# Patient Record
Sex: Female | Born: 1935 | Race: White | Hispanic: No | Marital: Single | Smoking: Former smoker
Health system: Southern US, Community
[De-identification: ages and names within clinical notes are randomized; demographics above are authoritative.]

## PROBLEM LIST (undated history)

## (undated) DIAGNOSIS — M170 Bilateral primary osteoarthritis of knee: Secondary | ICD-10-CM

## (undated) DIAGNOSIS — F0280 Dementia in other diseases classified elsewhere without behavioral disturbance: Secondary | ICD-10-CM

## (undated) DIAGNOSIS — G309 Alzheimer's disease, unspecified: Secondary | ICD-10-CM

## (undated) DIAGNOSIS — J449 Chronic obstructive pulmonary disease, unspecified: Secondary | ICD-10-CM

## (undated) DIAGNOSIS — F32A Depression, unspecified: Secondary | ICD-10-CM

## (undated) DIAGNOSIS — F329 Major depressive disorder, single episode, unspecified: Secondary | ICD-10-CM

## (undated) DIAGNOSIS — F419 Anxiety disorder, unspecified: Secondary | ICD-10-CM

## (undated) DIAGNOSIS — F0390 Unspecified dementia without behavioral disturbance: Secondary | ICD-10-CM

## (undated) DIAGNOSIS — R51 Headache: Secondary | ICD-10-CM

## (undated) DIAGNOSIS — E039 Hypothyroidism, unspecified: Secondary | ICD-10-CM

## (undated) DIAGNOSIS — Z9289 Personal history of other medical treatment: Secondary | ICD-10-CM

## (undated) HISTORY — PX: DILATION AND CURETTAGE OF UTERUS: SHX78

## (undated) HISTORY — PX: NASAL SINUS SURGERY: SHX719

## (undated) HISTORY — PX: TONSILLECTOMY: SUR1361

---

## 1963-01-22 DIAGNOSIS — Z9289 Personal history of other medical treatment: Secondary | ICD-10-CM

## 1963-01-22 HISTORY — PX: VAGINAL HYSTERECTOMY: SUR661

## 1963-01-22 HISTORY — DX: Personal history of other medical treatment: Z92.89

## 1988-09-21 HISTORY — PX: CATARACT EXTRACTION W/ INTRAOCULAR LENS  IMPLANT, BILATERAL: SHX1307

## 2012-11-18 ENCOUNTER — Observation Stay (HOSPITAL_COMMUNITY)
Admission: EM | Admit: 2012-11-18 | Discharge: 2012-11-19 | Disposition: A | Discharge status: Home / Self Care | Payer: Medicare Other | Attending: Internal Medicine | Admitting: Internal Medicine

## 2012-11-18 ENCOUNTER — Emergency Department (HOSPITAL_COMMUNITY): Payer: Medicare Other

## 2012-11-18 ENCOUNTER — Encounter (HOSPITAL_COMMUNITY): Payer: Self-pay | Admitting: Emergency Medicine

## 2012-11-18 DIAGNOSIS — D649 Anemia, unspecified: Secondary | ICD-10-CM | POA: Diagnosis present

## 2012-11-18 DIAGNOSIS — F29 Unspecified psychosis not due to a substance or known physiological condition: Secondary | ICD-10-CM | POA: Insufficient documentation

## 2012-11-18 DIAGNOSIS — Z87891 Personal history of nicotine dependence: Secondary | ICD-10-CM | POA: Insufficient documentation

## 2012-11-18 DIAGNOSIS — M1710 Unilateral primary osteoarthritis, unspecified knee: Secondary | ICD-10-CM | POA: Insufficient documentation

## 2012-11-18 DIAGNOSIS — Z79899 Other long term (current) drug therapy: Secondary | ICD-10-CM | POA: Insufficient documentation

## 2012-11-18 DIAGNOSIS — Z880 Allergy status to penicillin: Secondary | ICD-10-CM | POA: Insufficient documentation

## 2012-11-18 DIAGNOSIS — R609 Edema, unspecified: Secondary | ICD-10-CM | POA: Insufficient documentation

## 2012-11-18 DIAGNOSIS — R079 Chest pain, unspecified: Secondary | ICD-10-CM

## 2012-11-18 DIAGNOSIS — F411 Generalized anxiety disorder: Secondary | ICD-10-CM | POA: Insufficient documentation

## 2012-11-18 DIAGNOSIS — F0280 Dementia in other diseases classified elsewhere without behavioral disturbance: Secondary | ICD-10-CM | POA: Diagnosis present

## 2012-11-18 DIAGNOSIS — R0789 Other chest pain: Principal | ICD-10-CM | POA: Insufficient documentation

## 2012-11-18 DIAGNOSIS — E039 Hypothyroidism, unspecified: Secondary | ICD-10-CM | POA: Insufficient documentation

## 2012-11-18 DIAGNOSIS — Z885 Allergy status to narcotic agent status: Secondary | ICD-10-CM | POA: Insufficient documentation

## 2012-11-18 DIAGNOSIS — IMO0002 Reserved for concepts with insufficient information to code with codable children: Secondary | ICD-10-CM | POA: Insufficient documentation

## 2012-11-18 DIAGNOSIS — G309 Alzheimer's disease, unspecified: Secondary | ICD-10-CM | POA: Insufficient documentation

## 2012-11-18 DIAGNOSIS — J441 Chronic obstructive pulmonary disease with (acute) exacerbation: Secondary | ICD-10-CM | POA: Insufficient documentation

## 2012-11-18 DIAGNOSIS — F0390 Unspecified dementia without behavioral disturbance: Secondary | ICD-10-CM | POA: Insufficient documentation

## 2012-11-18 HISTORY — DX: Personal history of other medical treatment: Z92.89

## 2012-11-18 HISTORY — DX: Chronic obstructive pulmonary disease, unspecified: J44.9

## 2012-11-18 HISTORY — DX: Bilateral primary osteoarthritis of knee: M17.0

## 2012-11-18 HISTORY — DX: Dementia in other diseases classified elsewhere, unspecified severity, without behavioral disturbance, psychotic disturbance, mood disturbance, and anxiety: F02.80

## 2012-11-18 HISTORY — DX: Alzheimer's disease, unspecified: G30.9

## 2012-11-18 HISTORY — DX: Anxiety disorder, unspecified: F41.9

## 2012-11-18 HISTORY — DX: Depression, unspecified: F32.A

## 2012-11-18 HISTORY — DX: Headache: R51

## 2012-11-18 HISTORY — DX: Hypothyroidism, unspecified: E03.9

## 2012-11-18 HISTORY — DX: Major depressive disorder, single episode, unspecified: F32.9

## 2012-11-18 HISTORY — DX: Unspecified dementia, unspecified severity, without behavioral disturbance, psychotic disturbance, mood disturbance, and anxiety: F03.90

## 2012-11-18 LAB — CBC WITH DIFFERENTIAL/PLATELET
Eosinophils Absolute: 0.6 10*3/uL (ref 0.0–0.7)
Eosinophils Relative: 8 % — ABNORMAL HIGH (ref 0–5)
HCT: 32.2 % — ABNORMAL LOW (ref 36.0–46.0)
Hemoglobin: 10.5 g/dL — ABNORMAL LOW (ref 12.0–15.0)
Lymphocytes Relative: 19 % (ref 12–46)
Lymphs Abs: 1.5 10*3/uL (ref 0.7–4.0)
MCHC: 32.6 g/dL (ref 30.0–36.0)
MCV: 91.7 fL (ref 78.0–100.0)
Monocytes Absolute: 0.6 10*3/uL (ref 0.1–1.0)
Monocytes Relative: 8 % (ref 3–12)
RBC: 3.51 MIL/uL — ABNORMAL LOW (ref 3.87–5.11)
RDW: 13 % (ref 11.5–15.5)
WBC: 8 10*3/uL (ref 4.0–10.5)

## 2012-11-18 LAB — COMPREHENSIVE METABOLIC PANEL
ALT: 13 U/L (ref 0–35)
AST: 16 U/L (ref 0–37)
BUN: 25 mg/dL — ABNORMAL HIGH (ref 6–23)
CO2: 25 mEq/L (ref 19–32)
Calcium: 8.7 mg/dL (ref 8.4–10.5)
Creatinine, Ser: 0.98 mg/dL (ref 0.50–1.10)
GFR calc Af Amer: 59 mL/min — ABNORMAL LOW (ref >90)
GFR calc non Af Amer: 51 mL/min — ABNORMAL LOW (ref >90)
Glucose, Bld: 89 mg/dL (ref 70–99)
Sodium: 137 mEq/L (ref 135–145)
Total Protein: 7.2 g/dL (ref 6.0–8.3)

## 2012-11-18 LAB — TROPONIN I: Troponin I: <0.3 ng/mL (ref <0.30)

## 2012-11-18 LAB — PRO B NATRIURETIC PEPTIDE: Pro B Natriuretic peptide (BNP): 530.7 pg/mL — ABNORMAL HIGH (ref 0–450)

## 2012-11-18 MED ORDER — CELECOXIB 200 MG PO CAPS
200.0000 mg | ORAL_CAPSULE | Freq: Two times a day (BID) | ORAL | Status: DC
  Administered 2012-11-18 – 2012-11-19 (×2): 200 mg via ORAL
  Filled 2012-11-18 (×3): qty 1

## 2012-11-18 MED ORDER — ACETAMINOPHEN 650 MG RE SUPP: 650.0000 mg | Freq: Four times a day (QID) | RECTAL | Status: DC | PRN

## 2012-11-18 MED ORDER — CITALOPRAM HYDROBROMIDE 10 MG PO TABS
10.0000 mg | ORAL_TABLET | Freq: Every day | ORAL | Status: DC
  Administered 2012-11-19: 10 mg via ORAL
  Filled 2012-11-18: qty 1

## 2012-11-18 MED ORDER — LORATADINE 10 MG PO TABS
10.0000 mg | ORAL_TABLET | Freq: Every day | ORAL | Status: DC
  Administered 2012-11-19: 10 mg via ORAL
  Filled 2012-11-18: qty 1

## 2012-11-18 MED ORDER — SODIUM CHLORIDE 0.9 % IJ SOLN
3.0000 mL | Freq: Two times a day (BID) | INTRAMUSCULAR | Status: DC
  Administered 2012-11-18: 3 mL via INTRAVENOUS

## 2012-11-18 MED ORDER — FLUTICASONE PROPIONATE HFA 220 MCG/ACT IN AERO
1.0000 | INHALATION_SPRAY | Freq: Two times a day (BID) | RESPIRATORY_TRACT | Status: DC
  Administered 2012-11-19: 1 via RESPIRATORY_TRACT
  Filled 2012-11-18 (×2): qty 12

## 2012-11-18 MED ORDER — ENOXAPARIN SODIUM 30 MG/0.3ML ~~LOC~~ SOLN
30.0000 mg | SUBCUTANEOUS | Status: DC
  Administered 2012-11-18: 30 mg via SUBCUTANEOUS
  Filled 2012-11-18 (×2): qty 0.3

## 2012-11-18 MED ORDER — ENOXAPARIN SODIUM 40 MG/0.4ML ~~LOC~~ SOLN: 40.0000 mg | SUBCUTANEOUS | Status: DC

## 2012-11-18 MED ORDER — LORAZEPAM 0.5 MG PO TABS: 0.2500 mg | ORAL_TABLET | ORAL | Status: DC | PRN

## 2012-11-18 MED ORDER — TRAMADOL HCL 50 MG PO TABS
50.0000 mg | ORAL_TABLET | Freq: Three times a day (TID) | ORAL | Status: DC
  Administered 2012-11-18 – 2012-11-19 (×2): 50 mg via ORAL
  Filled 2012-11-18 (×2): qty 1

## 2012-11-18 MED ORDER — LORAZEPAM 2 MG/ML IJ SOLN
1.0000 mg | Freq: Once | INTRAMUSCULAR | Status: AC
  Administered 2012-11-18: 1 mg via INTRAVENOUS
  Filled 2012-11-18: qty 1

## 2012-11-18 MED ORDER — MEMANTINE HCL ER 28 MG PO CP24
28.0000 mg | ORAL_CAPSULE | Freq: Every day | ORAL | Status: DC
  Filled 2012-11-18 (×2): qty 28

## 2012-11-18 MED ORDER — ONDANSETRON HCL 4 MG PO TABS: 4.0000 mg | ORAL_TABLET | Freq: Four times a day (QID) | ORAL | Status: DC | PRN

## 2012-11-18 MED ORDER — MIRTAZAPINE 7.5 MG PO TABS
7.5000 mg | ORAL_TABLET | Freq: Every day | ORAL | Status: DC
  Administered 2012-11-18: 7.5 mg via ORAL
  Filled 2012-11-18 (×2): qty 1

## 2012-11-18 MED ORDER — ASPIRIN 325 MG PO TABS
325.0000 mg | ORAL_TABLET | Freq: Every day | ORAL | Status: DC
  Administered 2012-11-18 – 2012-11-19 (×2): 325 mg via ORAL
  Filled 2012-11-18 (×2): qty 1

## 2012-11-18 MED ORDER — NITROGLYCERIN 0.4 MG SL SUBL: 0.4000 mg | SUBLINGUAL_TABLET | SUBLINGUAL | Status: DC | PRN

## 2012-11-18 MED ORDER — RISPERIDONE 0.25 MG PO TABS
0.2500 mg | ORAL_TABLET | Freq: Every day | ORAL | Status: DC
  Administered 2012-11-19: 0.25 mg via ORAL
  Filled 2012-11-18: qty 1

## 2012-11-18 MED ORDER — SODIUM CHLORIDE 0.9 % IV SOLN
INTRAVENOUS | Status: DC
  Administered 2012-11-18: 16:00:00 via INTRAVENOUS

## 2012-11-18 MED ORDER — ONDANSETRON HCL 4 MG/2ML IJ SOLN: 4.0000 mg | Freq: Four times a day (QID) | INTRAMUSCULAR | Status: DC | PRN

## 2012-11-18 MED ORDER — MEMANTINE HCL ER 28 MG PO CP24: 28.0000 mg | ORAL_CAPSULE | Freq: Every day | ORAL | Status: DC

## 2012-11-18 MED ORDER — ACETAMINOPHEN 325 MG PO TABS: 650.0000 mg | ORAL_TABLET | Freq: Four times a day (QID) | ORAL | Status: DC | PRN

## 2012-11-18 NOTE — ED Notes (Signed)
Per EMS, pt was watching tv this am and started having chest pain. Pt sts some SOB. Denies chest pain currently. 12 lead nothing acute. Pt significant swelling to BLE. Chronic right knee pain. BP 141/64. Pulse 69. sats 95

## 2012-11-18 NOTE — ED Provider Notes (Signed)
CSN: [id number]     Arrival date & time 11/18/12  1245 History   First MD Initiated Contact with Patient 11/18/12 1352     Chief Complaint  Patient presents with  . Chest Pain   (Consider location/radiation/quality/duration/timing/severity/associated sxs/prior Treatment) Patient is a 79 y.o. female presenting with chest pain. The history is provided by the patient, a relative, the EMS personnel and a friend. The history is limited by the condition of the patient.  Chest Pain Associated symptoms: shortness of breath   Associated symptoms: no abdominal pain, no nausea and not vomiting    5 caveat applies to the history due to patient's dementia. Patient lives at assisted living consider whether at 11:30 this morning appeared to be in severe chest pain. Associated with shortness of breath patient initially denied any pain here upon arrival but then mentioned perhaps there is some pain. She's had bilateral lower trimming the swelling for some time. Patient cannot recall where the pain is now. Or was now.  Past Medical History  Diagnosis Date  . Dementia   . Anxiety   . Arthritis    History reviewed. No pertinent past surgical history. History reviewed. No pertinent family history. History  Substance Use Topics  . Smoking status: Never Smoker   . Smokeless tobacco: Not on file  . Alcohol Use: No   OB History   Grav Para Term Preterm Abortions TAB SAB Ect Mult Living                 Review of Systems  Unable to perform ROS Respiratory: Positive for shortness of breath.   Cardiovascular: Positive for chest pain.  Gastrointestinal: Negative for nausea, vomiting and abdominal pain.  Hematological: Does not bruise/bleed easily.  Psychiatric/Behavioral: Positive for confusion.   the rest of the review of systems is difficult to ascertain due to patient's history of dementia. She is a level V caveat.  Allergies  Morphine and related and Penicillins  Home Medications   Current  Outpatient Rx  Name  Route  Sig  Dispense  Refill  . acetaminophen (Q-NOL) 325 MG tablet   Oral   Take 325 mg by mouth 2 (two) times daily.         . celecoxib (CELEBREX) 200 MG capsule   Oral   Take 200 mg by mouth 2 (two) times daily.         . cetirizine (ZYRTEC) 10 MG tablet   Oral   Take 10 mg by mouth at bedtime.         . citalopram (CELEXA) 10 MG tablet   Oral   Take 10 mg by mouth daily.         . diclofenac sodium (VOLTAREN) 1 % GEL   Topical   Apply 4 g topically 2 (two) times daily. Apply to right knee         . fluticasone (FLOVENT HFA) 220 MCG/ACT inhaler   Inhalation   Inhale 1 puff into the lungs 2 (two) times daily.         Marland Kitchen LORazepam (ATIVAN) 0.5 MG tablet   Oral   Take 0.25 mg by mouth every 4 (four) hours as needed for anxiety. Do not exceed 1.5 mg /24h         . Memantine HCl ER (NAMENDA XR) 28 MG CP24   Oral   Take 28 mg by mouth daily.         . mirtazapine (REMERON) 15 MG tablet   Oral  Take 7.5 mg by mouth at bedtime.         . risperiDONE (RISPERDAL) 0.25 MG tablet   Oral   Take 0.25 mg by mouth daily.         . traMADol (ULTRAM) 50 MG tablet   Oral   Take 50 mg by mouth 3 (three) times daily.          BP 136/51  Pulse 64  Temp(Src) 97.8 F (36.6 C) (Oral)  Resp 13  SpO2 97% Physical Exam  Nursing note and vitals reviewed. Constitutional: She is oriented to person, place, and time. She appears well-developed and well-nourished. No distress.  HENT:  Head: Normocephalic and atraumatic.  Mouth/Throat: Oropharynx is clear and moist.  Eyes: Conjunctivae and EOM are normal. Pupils are equal, round, and reactive to light.  Neck: Normal range of motion.  Cardiovascular: Normal rate and regular rhythm.   No murmur heard. Pulmonary/Chest: Effort normal and breath sounds normal. No respiratory distress.  Abdominal: Soft. Bowel sounds are normal. There is no tenderness.  Musculoskeletal: Normal range of motion. She  exhibits edema.  Neurological: She is alert and oriented to person, place, and time. No cranial nerve deficit. She exhibits normal muscle tone. Coordination normal.  Skin: Skin is warm. No rash noted.    ED Course  Procedures (including critical care time) Labs Review Labs Reviewed  CBC WITH DIFFERENTIAL - Abnormal; Notable for the following:    RBC 3.51 (*)    Hemoglobin 10.5 (*)    HCT 32.2 (*)    Eosinophils Relative 8 (*)    All other components within normal limits  COMPREHENSIVE METABOLIC PANEL - Abnormal; Notable for the following:    BUN 25 (*)    GFR calc non Af Amer 51 (*)    GFR calc Af Amer 59 (*)    All other components within normal limits  PRO B NATRIURETIC PEPTIDE - Abnormal; Notable for the following:    Pro B Natriuretic peptide (BNP) 530.7 (*)    All other components within normal limits  TROPONIN I  URINALYSIS, ROUTINE W REFLEX MICROSCOPIC   Results for orders placed during the hospital encounter of 11/18/12  TROPONIN I      Result Value Range   Troponin I <0.30  <0.30 ng/mL  CBC WITH DIFFERENTIAL      Result Value Range   WBC 8.0  4.0 - 10.5 K/uL   RBC 3.51 (*) 3.87 - 5.11 MIL/uL   Hemoglobin 10.5 (*) 12.0 - 15.0 g/dL   HCT 16.1 (*) 09.6 - 04.5 %   MCV 91.7  78.0 - 100.0 fL   MCH 29.9  26.0 - 34.0 pg   MCHC 32.6  30.0 - 36.0 g/dL   RDW 40.9  81.1 - 91.4 %   Platelets 237  150 - 400 K/uL   Neutrophils Relative % 66  43 - 77 %   Neutro Abs 5.3  1.7 - 7.7 K/uL   Lymphocytes Relative 19  12 - 46 %   Lymphs Abs 1.5  0.7 - 4.0 K/uL   Monocytes Relative 8  3 - 12 %   Monocytes Absolute 0.6  0.1 - 1.0 K/uL   Eosinophils Relative 8 (*) 0 - 5 %   Eosinophils Absolute 0.6  0.0 - 0.7 K/uL   Basophils Relative 0  0 - 1 %   Basophils Absolute 0.0  0.0 - 0.1 K/uL  COMPREHENSIVE METABOLIC PANEL      Result Value Range  Sodium 137  135 - 145 mEq/L   Potassium 4.6  3.5 - 5.1 mEq/L   Chloride 102  96 - 112 mEq/L   CO2 25  19 - 32 mEq/L   Glucose, Bld 89  70  - 99 mg/dL   BUN 25 (*) 6 - 23 mg/dL   Creatinine, Ser 1.61  0.50 - 1.10 mg/dL   Calcium 8.7  8.4 - 09.6 mg/dL   Total Protein 7.2  6.0 - 8.3 g/dL   Albumin 3.6  3.5 - 5.2 g/dL   AST 16  0 - 37 U/L   ALT 13  0 - 35 U/L   Alkaline Phosphatase 69  39 - 117 U/L   Total Bilirubin 0.4  0.3 - 1.2 mg/dL   GFR calc non Af Amer 51 (*) >90 mL/min   GFR calc Af Amer 59 (*) >90 mL/min  PRO B NATRIURETIC PEPTIDE      Result Value Range   Pro B Natriuretic peptide (BNP) 530.7 (*) 0 - 450 pg/mL    Imaging Review Dg Chest 2 View  11/18/2012   CLINICAL DATA:  Chest pain.  EXAM: CHEST  2 VIEW  COMPARISON:  None.  FINDINGS: Cardiopericardial silhouette within normal limits. Aortic arch atherosclerosis. Apical lordotic projection on the frontal view. No airspace disease. No effusion. Lower thoracic compression fractures present, age indeterminate, a showing about 30% loss of anterior vertebral body height. This is probably at the T11 level.  IMPRESSION: 1. No acute cardiopulmonary disease. 2. Lower thoracic compression fracture is age indeterminate.   Electronically Signed   By: Andreas Newport M.D.   On: 11/18/2012 15:38    EKG Interpretation     Ventricular Rate:  64 PR Interval:  170 QRS Duration: 84 QT Interval:  432 QTC Calculation: 446 R Axis:   -13 Text Interpretation:  Sinus rhythm Low voltage, precordial leads Abnormal R-wave progression, early transition Consider anterior infarct No previous ECGs available            MDM   1. Chest pain    Patient with significant dementia.  He did have some sort of chest pain and then 11:30 this morning not clear when it resolved. Patient when she arrived here still is gone now but hasn't been in the room says his back. Initial troponins negative EKG without any acute changes. Chest x-ray without evidence of any pneumonia pneumothorax or pulmonary edema. Patient does have an elevated BNP. Patient has no cardiac history. Patient has no EKG for  comparison. Patient is followed by Dr. Smith Mince. She will most likely require admission for chest pain rule out due to her dementia and difficulty obtaining exactly when the chest pain went away.    Shelda Jakes, MD 11/18/12 [phone]

## 2012-11-18 NOTE — H&P (Signed)
[facility] History and Physical  Korin Setzler ZOX:[id number] DOB: 19-Feb-1924 DOA: 11/18/2012  Referring physician: ED PCP: Dr Lisbeth Renshaw in [place]  Chief Complaint:  Chest pain since 1 day  HPI:  79 year old female with severe  Alzheimer's dementia brought in by EMS for acute  onset of chest pain this morning. She reported of having substernal chest pain and back pain. Given her severe dementia she was unable to express the severity  and nature of the pain. It is also unclear how long she had the pain for. Her acute give her noticed that she was mildly short of breath as well. She forgot about having chest pain after a while and had to be reminded about the pain. Patient had baseline he is oriented to person and recognizes her immediate family only. Patient denied and headache, dizziness, nausea, vomiting, shortness of breath, abdominal pain. He did not have any fever or chills. Did not have any bowel or urinary symptoms.  In the ED EKG done was unremarkable. Initial troponin was negative. Blood work done showed mild anemia. Chest x-ray was unremarkable. [facility] called for admission and observation for rule out ACS.  Review of Systems:  As outlined in history of present illness. ROS limited due to severe dementia.    Past Medical History  Diagnosis Date  . Dementia   . Anxiety   . Arthritis    History reviewed. No pertinent past surgical history. Social History:  reports that she has never smoked. She does not have any smokeless tobacco history on file. She reports that she does not drink alcohol. Her drug history is not on file.  Allergies  Allergen Reactions  . Morphine And Related     On Nursing Home MAR, no reaction documented  . Penicillins     On Nursing Home MAR, no reaction documented     History reviewed. No pertinent family history.  Prior to Admission medications   Medication Sig Start Date End Date Taking? Authorizing Provider   acetaminophen (Q-NOL) 325 MG tablet Take 325 mg by mouth 2 (two) times daily.   Yes Historical Provider, MD  celecoxib (CELEBREX) 200 MG capsule Take 200 mg by mouth 2 (two) times daily.   Yes Historical Provider, MD  cetirizine (ZYRTEC) 10 MG tablet Take 10 mg by mouth at bedtime.   Yes Historical Provider, MD  citalopram (CELEXA) 10 MG tablet Take 10 mg by mouth daily.   Yes Historical Provider, MD  diclofenac sodium (VOLTAREN) 1 % GEL Apply 4 g topically 2 (two) times daily. Apply to right knee   Yes Historical Provider, MD  fluticasone (FLOVENT HFA) 220 MCG/ACT inhaler Inhale 1 puff into the lungs 2 (two) times daily.   Yes Historical Provider, MD  LORazepam (ATIVAN) 0.5 MG tablet Take 0.25 mg by mouth every 4 (four) hours as needed for anxiety. Do not exceed 1.5 mg /24h   Yes Historical Provider, MD  Memantine HCl ER (NAMENDA XR) 28 MG CP24 Take 28 mg by mouth daily.   Yes Historical Provider, MD  mirtazapine (REMERON) 15 MG tablet Take 7.5 mg by mouth at bedtime.   Yes Historical Provider, MD  risperiDONE (RISPERDAL) 0.25 MG tablet Take 0.25 mg by mouth daily.   Yes Historical Provider, MD  traMADol (ULTRAM) 50 MG tablet Take 50 mg by mouth 3 (three) times daily.   Yes Historical Provider, MD    Physical Exam:  Filed Vitals:   11/18/12 1415 11/18/12 1430 11/18/12 1445 11/18/12 1700  BP: 134/58  114/87 136/51 165/55  Pulse: 62 65 64 69  Temp:      TempSrc:      Resp: 16 23 13 13   SpO2: 96% 96% 97% 95%    Constitutional: Vital signs reviewed. Elderly female lying in bed in no acute distress Cardiovascular: RRR, S1 normal, S2 normal, no MRG, Pulmonary/Chest: CTAB, no wheezes, rales, or rhonchi Abdominal: Soft. Non-tender, non-distended, bowel sounds are normal, no masses, organomegaly, or guarding present.   extremities: Warm, no edema Neurological: A&O x1 ( oriented to self only), no focal deficit. Walks with a  Rolling walker .   Labs on Admission:  Basic Metabolic  Panel:  Recent Labs Lab 11/18/12 1544  NA 137  K 4.6  CL 102  CO2 25  GLUCOSE 89  BUN 25*  CREATININE 0.98  CALCIUM 8.7   Liver Function Tests:  Recent Labs Lab 11/18/12 1544  AST 16  ALT 13  ALKPHOS 69  BILITOT 0.4  PROT 7.2  ALBUMIN 3.6   No results found for this basename: LIPASE, AMYLASE,  in the last 168 hours No results found for this basename: AMMONIA,  in the last 168 hours CBC:  Recent Labs Lab 11/18/12 1544  WBC 8.0  NEUTROABS 5.3  HGB 10.5*  HCT 32.2*  MCV 91.7  PLT 237   Cardiac Enzymes:  Recent Labs Lab 11/18/12 1600  TROPONINI <0.30   BNP: No components found with this basename: POCBNP,  CBG: No results found for this basename: GLUCAP,  in the last 168 hours  Radiological Exams on Admission: Dg Chest 2 View  11/18/2012   CLINICAL DATA:  Chest pain.  EXAM: CHEST  2 VIEW  COMPARISON:  None.  FINDINGS: Cardiopericardial silhouette within normal limits. Aortic arch atherosclerosis. Apical lordotic projection on the frontal view. No airspace disease. No effusion. Lower thoracic compression fractures present, age indeterminate, a showing about 30% loss of anterior vertebral body height. This is probably at the T11 level.  IMPRESSION: 1. No acute cardiopulmonary disease. 2. Lower thoracic compression fracture is age indeterminate.   Electronically Signed   By: Andreas Newport M.D.   On: 11/18/2012 15:38    EKG: Normal sinus rhythm at 64, ST-T changes  Assessment/Plan Principal Problem:   Chest pain History very limited and unreliable due to severe dementia. He does not have significant risk factors for CAD and her HEARTscore for major cardiac event is low. Admit under observation. -aspirin 325 mg daily -s/l nitrate prn. Check lipid panel and Hb A1C -has trace edema with mildly elevated pro BNP. Check 2 D echo     Alzheimer's dementia Continue namenda    Anemia No baseline in system.     Code Status: DNR Family Communication:  daughter at bedside Disposition Plan: home in am if ruled out for ACS  Eddie North [facility] Pager [phone]  If 7PM-7AM, please contact night-coverage [email or web address] Password [facility] 11/18/2012, 6:13 PM   Total time spent: 50 minutes

## 2012-11-18 NOTE — ED Notes (Signed)
Patient transported to X-ray 

## 2012-11-18 NOTE — ED Notes (Signed)
Admitting MD in to assess pt for admission 

## 2012-11-19 LAB — HEMOGLOBIN A1C
Hgb A1c MFr Bld: 4.8 % (ref <5.7)
Mean Plasma Glucose: 91 mg/dL (ref <117)

## 2012-11-19 LAB — LIPID PANEL
Cholesterol: 220 mg/dL — ABNORMAL HIGH (ref 0–200)
VLDL: 31 mg/dL (ref 0–40)

## 2012-11-19 LAB — TROPONIN I: Troponin I: <0.3 ng/mL (ref <0.30)

## 2012-11-19 MED ORDER — FLUTICASONE PROPIONATE HFA 220 MCG/ACT IN AERO: 1.0000 | INHALATION_SPRAY | Freq: Two times a day (BID) | RESPIRATORY_TRACT | Status: DC

## 2012-11-19 NOTE — Progress Notes (Addendum)
CSW (Clinical Child psychotherapist) prepared pt dc packet and placed with shadow chart. Per facility, pt will not need a new FL2 as the pt has been out of the facility for less than 72 hours and is observation status in hospital.  Non emergent ambulance transport has been arranged. Pt family, facility, and nurse aware. CSW signing off.  Latonyia Lopata, LCSWA [phone]

## 2012-11-19 NOTE — Discharge Summary (Signed)
Physician Discharge Summary  Meagan Meagan [name] UJW:[id number] DOB: September 21, 1924 DOA: 11/18/2012  PCP: Talmadge Coventry, MD  Admit date: 11/18/2012 Discharge date: 11/19/2012  Time spent:25 minutes  Recommendations for Outpatient Follow-up:  D/c to assist living. follow up with PCP in 1 week. Please consider 2D echo as outpatient if patient has further symptoms.  Discharge Diagnoses:  Principal Problem:   Chest pain, likely musculoskeletal  Active Problems:   Alzheimer's dementia   Anemia   Discharge Condition: fair  Diet recommendation: *regular  There were no vitals filed for this visit.  History of present illness:  79 year old Meagan [name] with severe Alzheimer's dementia brought in by EMS for acute onset of chest pain this morning. She reported of having substernal chest pain and back pain. Given her severe dementia she was unable to express the severity and nature of the pain. It is also unclear how long she had the pain for. Her caregiver noticed that she was mildly short of breath as well. She forgot about having chest pain after a while and had to be reminded about the pain. Patient at baseline is oriented to person and recognizes her immediate family only.  Patient denied and headache, dizziness, nausea, vomiting, shortness of breath, abdominal pain. He did not have any fever or chills. Did not have any bowel or urinary symptoms.  In the ED  EKG done was unremarkable. Initial troponin was negative. Blood work done showed mild anemia. Chest x-ray was unremarkable. [facility] called for admission and observation for rule out ACS.   Hospital Course:  Patient admitted under observation on telemetry. Placed on ASA and prn s/l nitrate. Serial troponins were negative. Denied further chest pain symptoms. Her HEART score is quite low for major cardiac events. She doesn't  need inpatient 2 D echo earlier planned. Her symptoms are likely musculoskeletal in nature.    Patient  clinically stable and can be discharged home with outpt PCP follow up.   Consultations:  none  Discharge Exam: Filed Vitals:   11/19/12 0345  BP: 133/82  Pulse: 67  Temp: 98.1 F (36.7 C)  Resp: 18    genenral:. Elderly Meagan [name] lying in bed in no acute distress  Cardiovascular: RRR, S1 normal, S2 normal, no MRG,  Pulmonary/Chest: CTAB, no wheezes, rales, or rhonchi  Abdominal: Soft. Non-tender, non-distended, bowel sounds are normal,  extremities: Warm, no edema Neurological: A&O x1 ( oriented to self only),  Discharge Instructions     Medication List         celecoxib 200 MG capsule  Commonly known as:  CELEBREX  Take 200 mg by mouth 2 (two) times daily.     cetirizine 10 MG tablet  Commonly known as:  ZYRTEC  Take 10 mg by mouth at bedtime.     citalopram 10 MG tablet  Commonly known as:  CELEXA  Take 10 mg by mouth daily.     diclofenac sodium 1 % Gel  Commonly known as:  VOLTAREN  Apply 4 g topically 2 (two) times daily. Apply to right knee     fluticasone 220 MCG/ACT inhaler  Commonly known as:  FLOVENT HFA  Inhale 1 puff into the lungs 2 (two) times daily.     LORazepam 0.5 MG tablet  Commonly known as:  ATIVAN  Take 0.25 mg by mouth every 4 (four) hours as needed for anxiety. Do not exceed 1.5 mg /24h     mirtazapine 15 MG tablet  Commonly known as:  REMERON  Take 7.5 mg by  mouth at bedtime.     NAMENDA XR 28 MG Cp24  Generic drug:  Memantine HCl ER  Take 28 mg by mouth daily.     Q-NOL 325 MG tablet  Generic drug:  acetaminophen  Take 325 mg by mouth 2 (two) times daily.     risperiDONE 0.25 MG tablet  Commonly known as:  RISPERDAL  Take 0.25 mg by mouth daily.     traMADol 50 MG tablet  Commonly known as:  ULTRAM  Take 50 mg by mouth 3 (three) times daily.       Allergies  Allergen Reactions  . Morphine And Related     On Nursing Home MAR, no reaction documented  . Penicillins     On Nursing Home MAR, no reaction documented         Follow-up Information   Follow up with MAZZOCCHI, Rise Mu, MD In 1 week.   Specialty:  Family Medicine   Contact information:   [address] Laurell Josephs[address] [place] [address] [phone]        The results of significant diagnostics from this hospitalization (including imaging, microbiology, ancillary and laboratory) are listed below for reference.    Significant Diagnostic Studies: Dg Chest 2 View  11/18/2012   CLINICAL DATA:  Chest pain.  EXAM: CHEST  2 VIEW  COMPARISON:  None.  FINDINGS: Cardiopericardial silhouette within normal limits. Aortic arch atherosclerosis. Apical lordotic projection on the frontal view. No airspace disease. No effusion. Lower thoracic compression fractures present, 79 indeterminate, a showing about 30% loss of anterior vertebral body height. This is probably at the T11 level.  IMPRESSION: 1. No acute cardiopulmonary disease. 2. Lower thoracic compression fracture is age indeterminate.   Electronically Signed   By: Andreas Newport M.D.   On: 11/18/2012 15:38    Microbiology: No results found for this or any previous visit (from the past 240 hour(s)).   Labs: Basic Metabolic Panel:  Recent Labs Lab 11/18/12 1544  NA 137  K 4.6  CL 102  CO2 25  GLUCOSE 89  BUN 25*  CREATININE 0.98  CALCIUM 8.7   Liver Function Tests:  Recent Labs Lab 11/18/12 1544  AST 16  ALT 13  ALKPHOS 69  BILITOT 0.4  PROT 7.2  ALBUMIN 3.6   No results found for this basename: LIPASE, AMYLASE,  in the last 168 hours No results found for this basename: AMMONIA,  in the last 168 hours CBC:  Recent Labs Lab 11/18/12 1544  WBC 8.0  NEUTROABS 5.3  HGB 10.5*  HCT 32.2*  MCV 91.7  PLT 237   Cardiac Enzymes:  Recent Labs Lab 11/18/12 1600 11/18/12 2200 11/19/12 0425  TROPONINI <0.30 <0.30 <0.30   BNP: BNP (last 3 results)  Recent Labs  11/18/12 1600  PROBNP 530.7*   CBG: No results found for this basename: GLUCAP,  in the last  168 hours     Signed:  Rosangela Fehrenbach  [facility] 11/19/2012, 9:59 AM

## 2012-11-19 NOTE — Progress Notes (Signed)
Clinical Social Work Department BRIEF PSYCHOSOCIAL ASSESSMENT 11/19/2012  Patient:  Meagan [name], Meagan [name]     Account Number:  [id number]     Admit date:  11/18/2012  Clinical Social Worker:  Harless Nakayama  Date/Time:  11/19/2012 09:30 AM  Referred by:  Physician  Date Referred:  11/19/2012 Referred for  ALF Placement   Other Referral:   Interview type:  Family Other interview type:   Spoke with pt daughter    PSYCHOSOCIAL DATA Living Status:  FACILITY Admitted from facility:  [facility] Level of care:  Assisted Living Primary support name:  [name] [phone] Primary support relationship to patient:  CHILD, ADULT Degree of support available:   Pt has supportive daughter and son-in-law    CURRENT CONCERNS Current Concerns  Post-Acute Placement   Other Concerns:    SOCIAL WORK ASSESSMENT / PLAN CSW informed that pt was admitted from facility. CSW spoke with pt daugher Britta Mccreedy and confirmed pt is form [facility] and plan is to return. Pt daughter informed CSW that pt will need non-emergent ambulance transport. CSW called and left messgae with Arline Asp at [facility] to confirm pt is okay to return and to inquire as to whether pt will need new FL2 as she has been here observation less than 24 hrs. CSW awaiting return phone call. CSW informed pt nurse will need Gold DNR form to transport pt.   Assessment/plan status:  Psychosocial Support/Ongoing Assessment of Needs Other assessment/ plan:   Information/referral to community resources:   None needed    PATIENT'S/FAMILY'S RESPONSE TO PLAN OF CARE: Pt family agreeable to pt returning to ALF       [employer], LCSWA [phone]

## 2013-02-15 ENCOUNTER — Emergency Department (HOSPITAL_COMMUNITY): Payer: Medicare Other

## 2013-02-15 ENCOUNTER — Encounter (HOSPITAL_COMMUNITY): Payer: Self-pay | Admitting: Emergency Medicine

## 2013-02-15 ENCOUNTER — Inpatient Hospital Stay (HOSPITAL_COMMUNITY)
Admission: EM | Admit: 2013-02-15 | Discharge: 2013-02-19 | DRG: 640 | Disposition: A | Discharge status: Nursing Home (Includes ICF) | Payer: Medicare Other | Attending: Internal Medicine | Admitting: Internal Medicine

## 2013-02-15 DIAGNOSIS — G9341 Metabolic encephalopathy: Secondary | ICD-10-CM | POA: Diagnosis present

## 2013-02-15 DIAGNOSIS — E039 Hypothyroidism, unspecified: Secondary | ICD-10-CM | POA: Diagnosis present

## 2013-02-15 DIAGNOSIS — Z87440 Personal history of urinary (tract) infections: Secondary | ICD-10-CM

## 2013-02-15 DIAGNOSIS — G309 Alzheimer's disease, unspecified: Secondary | ICD-10-CM | POA: Diagnosis present

## 2013-02-15 DIAGNOSIS — F411 Generalized anxiety disorder: Secondary | ICD-10-CM | POA: Diagnosis present

## 2013-02-15 DIAGNOSIS — R4182 Altered mental status, unspecified: Secondary | ICD-10-CM | POA: Diagnosis present

## 2013-02-15 DIAGNOSIS — Z885 Allergy status to narcotic agent status: Secondary | ICD-10-CM

## 2013-02-15 DIAGNOSIS — M1710 Unilateral primary osteoarthritis, unspecified knee: Secondary | ICD-10-CM | POA: Diagnosis present

## 2013-02-15 DIAGNOSIS — D509 Iron deficiency anemia, unspecified: Secondary | ICD-10-CM | POA: Diagnosis present

## 2013-02-15 DIAGNOSIS — N179 Acute kidney failure, unspecified: Secondary | ICD-10-CM | POA: Diagnosis present

## 2013-02-15 DIAGNOSIS — N182 Chronic kidney disease, stage 2 (mild): Secondary | ICD-10-CM | POA: Diagnosis present

## 2013-02-15 DIAGNOSIS — Z880 Allergy status to penicillin: Secondary | ICD-10-CM

## 2013-02-15 DIAGNOSIS — R32 Unspecified urinary incontinence: Secondary | ICD-10-CM | POA: Diagnosis present

## 2013-02-15 DIAGNOSIS — J449 Chronic obstructive pulmonary disease, unspecified: Secondary | ICD-10-CM | POA: Diagnosis present

## 2013-02-15 DIAGNOSIS — Z79899 Other long term (current) drug therapy: Secondary | ICD-10-CM

## 2013-02-15 DIAGNOSIS — F329 Major depressive disorder, single episode, unspecified: Secondary | ICD-10-CM | POA: Diagnosis present

## 2013-02-15 DIAGNOSIS — Z87891 Personal history of nicotine dependence: Secondary | ICD-10-CM

## 2013-02-15 DIAGNOSIS — D649 Anemia, unspecified: Secondary | ICD-10-CM

## 2013-02-15 DIAGNOSIS — Z66 Do not resuscitate: Secondary | ICD-10-CM | POA: Diagnosis present

## 2013-02-15 DIAGNOSIS — E871 Hypo-osmolality and hyponatremia: Principal | ICD-10-CM | POA: Diagnosis present

## 2013-02-15 DIAGNOSIS — J4489 Other specified chronic obstructive pulmonary disease: Secondary | ICD-10-CM | POA: Diagnosis present

## 2013-02-15 DIAGNOSIS — F0280 Dementia in other diseases classified elsewhere without behavioral disturbance: Secondary | ICD-10-CM | POA: Diagnosis present

## 2013-02-15 DIAGNOSIS — F3289 Other specified depressive episodes: Secondary | ICD-10-CM | POA: Diagnosis present

## 2013-02-15 LAB — POCT I-STAT TROPONIN I: TROPONIN I, POC: 0.03 ng/mL (ref 0.00–0.08)

## 2013-02-15 LAB — CBC
HCT: 27.5 % — ABNORMAL LOW (ref 36.0–46.0)
Hemoglobin: 9.4 g/dL — ABNORMAL LOW (ref 12.0–15.0)
MCH: 29.7 pg (ref 26.0–34.0)
MCHC: 34.2 g/dL (ref 30.0–36.0)
MCV: 86.8 fL (ref 78.0–100.0)
Platelets: 223 10*3/uL (ref 150–400)
RBC: 3.17 MIL/uL — ABNORMAL LOW (ref 3.87–5.11)
RDW: 12.7 % (ref 11.5–15.5)
WBC: 4.4 10*3/uL (ref 4.0–10.5)

## 2013-02-15 LAB — CBC WITH DIFFERENTIAL/PLATELET
BASOS ABS: 0 10*3/uL (ref 0.0–0.1)
BASOS PCT: 0 % (ref 0–1)
EOS ABS: 0.1 10*3/uL (ref 0.0–0.7)
EOS PCT: 2 % (ref 0–5)
HEMATOCRIT: 29.2 % — AB (ref 36.0–46.0)
HEMOGLOBIN: 10.2 g/dL — AB (ref 12.0–15.0)
Lymphocytes Relative: 14 % (ref 12–46)
Lymphs Abs: 0.7 10*3/uL (ref 0.7–4.0)
MCH: 30.1 pg (ref 26.0–34.0)
MCHC: 34.9 g/dL (ref 30.0–36.0)
MCV: 86.1 fL (ref 78.0–100.0)
MONO ABS: 1.1 10*3/uL — AB (ref 0.1–1.0)
MONOS PCT: 22 % — AB (ref 3–12)
NEUTROS ABS: 3.1 10*3/uL (ref 1.7–7.7)
Neutrophils Relative %: 61 % (ref 43–77)
Platelets: 261 10*3/uL (ref 150–400)
RBC: 3.39 MIL/uL — ABNORMAL LOW (ref 3.87–5.11)
RDW: 12.8 % (ref 11.5–15.5)
WBC: 5.1 10*3/uL (ref 4.0–10.5)

## 2013-02-15 LAB — HEPATIC FUNCTION PANEL
ALBUMIN: 3.7 g/dL (ref 3.5–5.2)
ALT: 13 U/L (ref 0–35)
AST: 16 U/L (ref 0–37)
Alkaline Phosphatase: 62 U/L (ref 39–117)
Bilirubin, Direct: <0.2 mg/dL (ref 0.0–0.3)
Total Bilirubin: 0.5 mg/dL (ref 0.3–1.2)
Total Protein: 7 g/dL (ref 6.0–8.3)

## 2013-02-15 LAB — URINALYSIS, ROUTINE W REFLEX MICROSCOPIC
Bilirubin Urine: NEGATIVE
Glucose, UA: NEGATIVE mg/dL
Hgb urine dipstick: NEGATIVE
Ketones, ur: NEGATIVE mg/dL
LEUKOCYTES UA: NEGATIVE
NITRITE: NEGATIVE
PROTEIN: NEGATIVE mg/dL
Specific Gravity, Urine: 1.015 (ref 1.005–1.030)
UROBILINOGEN UA: 1 mg/dL (ref 0.0–1.0)
pH: 7 (ref 5.0–8.0)

## 2013-02-15 LAB — BASIC METABOLIC PANEL
BUN: 18 mg/dL (ref 6–23)
CALCIUM: 8.6 mg/dL (ref 8.4–10.5)
CHLORIDE: 85 meq/L — AB (ref 96–112)
CO2: 22 mEq/L (ref 19–32)
Creatinine, Ser: 1.32 mg/dL — ABNORMAL HIGH (ref 0.50–1.10)
GFR calc Af Amer: 41 mL/min — ABNORMAL LOW (ref >90)
GFR, EST NON AFRICAN AMERICAN: 35 mL/min — AB (ref >90)
Glucose, Bld: 107 mg/dL — ABNORMAL HIGH (ref 70–99)
Potassium: 5 mEq/L (ref 3.7–5.3)
Sodium: 120 mEq/L — CL (ref 137–147)

## 2013-02-15 LAB — INFLUENZA PANEL BY PCR (TYPE A & B)
H1N1FLUPCR: NOT DETECTED
INFLBPCR: NEGATIVE
Influenza A By PCR: NEGATIVE

## 2013-02-15 LAB — GLUCOSE, CAPILLARY: Glucose-Capillary: 118 mg/dL — ABNORMAL HIGH (ref 70–99)

## 2013-02-15 MED ORDER — SODIUM CHLORIDE 0.9 % IV BOLUS (SEPSIS)
500.0000 mL | Freq: Once | INTRAVENOUS | Status: AC
  Administered 2013-02-15: 500 mL via INTRAVENOUS

## 2013-02-15 MED ORDER — SODIUM CHLORIDE 0.9 % IJ SOLN
3.0000 mL | Freq: Two times a day (BID) | INTRAMUSCULAR | Status: DC
  Administered 2013-02-15 – 2013-02-16 (×2): 3 mL via INTRAVENOUS

## 2013-02-15 MED ORDER — HYDROCODONE-ACETAMINOPHEN 5-325 MG PO TABS
1.0000 | ORAL_TABLET | Freq: Every evening | ORAL | Status: DC | PRN
  Administered 2013-02-15: 1 via ORAL
  Filled 2013-02-15: qty 1

## 2013-02-15 MED ORDER — LORAZEPAM 0.5 MG PO TABS
0.2500 mg | ORAL_TABLET | Freq: Three times a day (TID) | ORAL | Status: DC | PRN
  Administered 2013-02-15 – 2013-02-16 (×2): 0.25 mg via ORAL
  Filled 2013-02-15 (×2): qty 1

## 2013-02-15 MED ORDER — SODIUM CHLORIDE 0.9 % IV SOLN
INTRAVENOUS | Status: AC
  Administered 2013-02-15 (×2): via INTRAVENOUS

## 2013-02-15 MED ORDER — OSELTAMIVIR PHOSPHATE 75 MG PO CAPS
75.0000 mg | ORAL_CAPSULE | Freq: Two times a day (BID) | ORAL | Status: DC
  Filled 2013-02-15: qty 1

## 2013-02-15 MED ORDER — ONDANSETRON HCL 4 MG/2ML IJ SOLN
4.0000 mg | Freq: Once | INTRAMUSCULAR | Status: AC
  Administered 2013-02-15: 4 mg via INTRAVENOUS
  Filled 2013-02-15: qty 2

## 2013-02-15 MED ORDER — LEVALBUTEROL HCL 0.63 MG/3ML IN NEBU: 0.6300 mg | INHALATION_SOLUTION | Freq: Four times a day (QID) | RESPIRATORY_TRACT | Status: DC | PRN

## 2013-02-15 MED ORDER — ONDANSETRON HCL 4 MG/2ML IJ SOLN: 4.0000 mg | Freq: Four times a day (QID) | INTRAMUSCULAR | Status: DC | PRN

## 2013-02-15 MED ORDER — ONDANSETRON HCL 4 MG PO TABS: 4.0000 mg | ORAL_TABLET | Freq: Four times a day (QID) | ORAL | Status: DC | PRN

## 2013-02-15 MED ORDER — ACETAMINOPHEN 325 MG PO TABS
650.0000 mg | ORAL_TABLET | Freq: Four times a day (QID) | ORAL | Status: DC | PRN
  Administered 2013-02-15 – 2013-02-19 (×3): 650 mg via ORAL
  Filled 2013-02-15 (×3): qty 2

## 2013-02-15 MED ORDER — OSELTAMIVIR PHOSPHATE 75 MG PO CAPS
75.0000 mg | ORAL_CAPSULE | Freq: Every day | ORAL | Status: DC
  Administered 2013-02-15: 75 mg via ORAL
  Filled 2013-02-15 (×3): qty 1

## 2013-02-15 MED ORDER — FLUTICASONE PROPIONATE HFA 220 MCG/ACT IN AERO
1.0000 | INHALATION_SPRAY | Freq: Two times a day (BID) | RESPIRATORY_TRACT | Status: DC
  Administered 2013-02-15 – 2013-02-19 (×6): 1 via RESPIRATORY_TRACT
  Filled 2013-02-15: qty 12

## 2013-02-15 MED ORDER — ACETAMINOPHEN 650 MG RE SUPP: 650.0000 mg | Freq: Four times a day (QID) | RECTAL | Status: DC | PRN

## 2013-02-15 MED ORDER — ENOXAPARIN SODIUM 40 MG/0.4ML ~~LOC~~ SOLN
40.0000 mg | SUBCUTANEOUS | Status: DC
  Administered 2013-02-15 – 2013-02-18 (×4): 40 mg via SUBCUTANEOUS
  Filled 2013-02-15 (×6): qty 0.4

## 2013-02-15 NOTE — ED Notes (Signed)
Bed: WA06 Expected date:  Expected time:  Means of arrival:  Comments: ems 

## 2013-02-15 NOTE — ED Provider Notes (Signed)
TIME SEEN: 3:51 PM  CHIEF COMPLAINT: Generalized weakness, altered mental status, nausea  HPI: Patient is a 80 y.o. F with history of Alzheimer's dementia, COPD, hypothyroidism who presents the emergency department accompanied by her daughter from [facility] with complaints of temperature of 99.5, nausea, generalized weakness and altered mental status. Patient's daughter provides most of the history given patient's dementia. She states that her mother has been sleeping more than normal and has been very weak, has difficulty lifting her arms or legs off the bed which is abnormal for her. She states that the past month she has had increased number of falls and has fallen 6 times in the past 3 days. There is no history of head injury. She's not on anticoagulation. Daughter reports the patient was recently on 5 days of Septra for UTI and finished her last dose today. She is complaining of nausea. She's not had any cough, vomiting or diarrhea. No rash. Daughter is concerned the patient may have influenza as there have been sick contacts. Daughter also reports that patient has not voided today. She is incontinent of urine at baseline and wears diapers. Patient's daughter is concerned the patient may be dehydrated as "she does not drink or eat well".  Patient denies any pain currently.  ROS: Level V caveat for dementia  PAST MEDICAL HISTORY/PAST SURGICAL HISTORY:  Past Medical History  Diagnosis Date  . Dementia   . Anxiety   . Alzheimer disease   . COPD (chronic obstructive pulmonary disease)   . Hypothyroidism     "on pills years ago; stopped taking them in the 1960's" (11/18/2012)  . History of blood transfusion 1965    "w/hysterectomy" (11/18/2012)  . Headache(784.0)     "semi frequently; sometimes sinus; sometimes generalized" (11/18/2012)  . Osteoarthritis of both knees   . Depression     MEDICATIONS:  Prior to Admission medications   Medication Sig Start Date End Date  Taking? Authorizing Provider  acetaminophen (Q-NOL) 325 MG tablet Take 325 mg by mouth 2 (two) times daily.    Historical Provider, MD  celecoxib (CELEBREX) 200 MG capsule Take 200 mg by mouth 2 (two) times daily.    Historical Provider, MD  cetirizine (ZYRTEC) 10 MG tablet Take 10 mg by mouth at bedtime.    Historical Provider, MD  diclofenac sodium (VOLTAREN) 1 % GEL Apply 4 g topically 2 (two) times daily. Apply to right knee    Historical Provider, MD  fluticasone (FLOVENT HFA) 220 MCG/ACT inhaler Inhale 1 puff into the lungs 2 (two) times daily. 11/19/12   Nishant Dhungel, MD  LORazepam (ATIVAN) 0.5 MG tablet Take 0.25 mg by mouth every 4 (four) hours as needed for anxiety. Do not exceed 1.5 mg /24h    Historical Provider, MD  Memantine HCl ER (NAMENDA XR) 28 MG CP24 Take 28 mg by mouth daily.    Historical Provider, MD  mirtazapine (REMERON) 15 MG tablet Take 7.5 mg by mouth at bedtime.    Historical Provider, MD  risperiDONE (RISPERDAL) 0.25 MG tablet Take 0.25 mg by mouth daily.    Historical Provider, MD  traMADol (ULTRAM) 50 MG tablet Take 50 mg by mouth 3 (three) times daily.    Historical Provider, MD    ALLERGIES:  Allergies  Allergen Reactions  . Morphine And Related     On Nursing Home MAR, no reaction documented  . Penicillins     On Nursing Home MAR, no reaction documented     SOCIAL  HISTORY:  History  Substance Use Topics  . Smoking status: Former Smoker -- 1.50 packs/day for 20 years    Types: Cigarettes    Quit date: 08/21/1964  . Smokeless tobacco: Never Used  . Alcohol Use: Yes     Comment: 11/18/2012 "hasn't had any alcoholic beverage in 4 yrs; prior to that she might have a glass of wine ~ q month"    FAMILY HISTORY: No family history on file.  EXAM: BP 124/87  Pulse 87  Temp(Src) 97.3 F (36.3 C) (Oral)  Resp 18  SpO2 94% CONSTITUTIONAL: Alert and oriented to person only and responds appropriately to questions intermittently but will follow  commands. Well-appearing; well-nourished, elderly, no apparent distress HEAD: Normocephalic EYES: Conjunctivae clear, PERRL ENT: normal nose; no rhinorrhea; slightly dry mucous membranes; pharynx without lesions noted NECK: Supple, no meningismus, no LAD  CARD: RRR; S1 and S2 appreciated; no murmurs, no clicks, no rubs, no gallops RESP: Normal chest excursion without splinting or tachypnea; breath sounds clear and equal bilaterally; no wheezes, no rhonchi, no rales,  ABD/GI: Normal bowel sounds; non-distended; soft, non-tender, no rebound, no guarding BACK:  The back appears normal and is non-tender to palpation, there is no CVA tenderness EXT: Normal ROM in all joints; non-tender to palpation; minimal bilateral ankle edema; normal capillary refill; no cyanosis    SKIN: Normal color for age and race; warm NEURO: Moves all extremities equally, cranial nerves II through XII intact, sensation to light touch intact diffusely PSYCH: The patient's mood and manner are appropriate. Grooming and personal hygiene are appropriate.  MEDICAL DECISION MAKING: Patient here with generalized weakness and altered mental status. She was recently treated for urinary tract infection. She is hemodynamically stable. She is neurologically intact and in no apparent distress. Will obtain labs, urine, blood cultures, chest x-ray, EKG and troponin. We'll give IV fluids and anti-emetics.  ED PROGRESS: Patient's labs show hyponatremia with a sodium of 120. She also is a creatinine of 1.32. Suspect dehydration. Troponin negative. Urine shows no sign of infection. Chest x-ray clear. Patient's symptoms have improved with 250 mL of IV fluids. Will continue to hydrate slowly. Discussed with hospitalist for admission. Patient and daughter at bedside are updated on plan and are comfortable with this plan.   EKG Interpretation    Date/Time:  Monday February 15 2013 15:50:06 EST Ventricular Rate:  77 PR Interval:  171 QRS  Duration: 82 QT Interval:  401 QTC Calculation: 454 R Axis:   -15 Text Interpretation:  Age not entered, assumed to be  80 years old for purpose of ECG interpretation Sinus rhythm Atrial premature complex Borderline left axis deviation Abnormal R-wave progression, early transition No significant change since last tracing Confirmed by WARD  DO, KRISTEN [id number]) on 02/15/2013 3:55:23 PM               Layla Maw Ward, DO 02/15/13 1815

## 2013-02-15 NOTE — Progress Notes (Signed)
Utilization Review completed.  Manny Vitolo RN CM  

## 2013-02-15 NOTE — ED Notes (Signed)
Pt from [facility] with c/o generalized weakness.  Recent tx for UTI (last dose today). A+ox4.  Neuros intact per EMS.

## 2013-02-15 NOTE — ED Notes (Signed)
Pt to CXR.

## 2013-02-15 NOTE — H&P (Addendum)
[facility] History and Physical  Dashawna Delbridge ZOX:[id number] DOB: [birth year] DOA: 02/15/2013  Referring physician:  PCP: MAZZOCCHI, Rise Mu, MD   Chief Complaint: Fever, altered mental status  HPI:  80 year old female from [facility] comes in with generalized weakness, altered mental status, decreased by mouth intake for the last 24 hours. History is provided by the daughter. The patient had a low-grade fever of 99.3 at the nursing home. She was recently treated for UTI with Bactrim. She was also started on Tamiflu today for suspected flu but has not taken any yet. Patient's dementia is progressively getting worse, she has been sundowning. She received a wheelchair prescribed by her PCP today. She states that the past month she has had increased number of falls and has fallen 6 times in the past 3 days. There is no history of head injury.She is incontinent of urine at baseline and wears diapers. Patient's daughter is concerned the patient may be dehydrated as "she does not drink or eat well". Patient denies any pain currently. Sodium is 120, rest of the workup was negative  No cough no chest pain no shortness of breath, no nausea vomiting or diarrhea      Review of Systems: negative for the following  Constitutional: As in history of present illness HEENT: Denies photophobia, eye pain, redness, hearing loss, ear pain, congestion, sore throat, rhinorrhea, sneezing, mouth sores, trouble swallowing, neck pain, neck stiffness and tinnitus.  Respiratory: Denies SOB, DOE, cough, chest tightness, and wheezing.  Cardiovascular: Denies chest pain, palpitations and leg swelling.  Gastrointestinal: Denies nausea, vomiting, abdominal pain, diarrhea, constipation, blood in stool and abdominal distention.  Genitourinary: Denies dysuria, urgency, frequency, hematuria, flank pain and difficulty urinating.  Musculoskeletal: Denies myalgias, back pain, joint swelling, arthralgias  and gait problem.  Skin: Denies pallor, rash and wound.  Neurological: As in history of present illness Hematological: Denies adenopathy. Easy bruising, personal or family bleeding history  Psychiatric/Behavioral: Denies suicidal ideation, mood changes, confusion, nervousness, sleep disturbance and agitation       Past Medical History  Diagnosis Date  . Dementia   . Anxiety   . Alzheimer disease   . COPD (chronic obstructive pulmonary disease)   . Hypothyroidism     "on pills years ago; stopped taking them in the 1960's" (11/18/2012)  . History of blood transfusion 1965    "w/hysterectomy" (11/18/2012)  . Headache(784.0)     "semi frequently; sometimes sinus; sometimes generalized" (11/18/2012)  . Osteoarthritis of both knees   . Depression      Past Surgical History  Procedure Laterality Date  . Tonsillectomy      "twice" (11/18/2012)  . Vaginal hysterectomy  1965  . Dilation and curettage of uterus      "several before hysterectomy" (11/18/2012)  . Cesarean section  1954; 1958  . Nasal sinus surgery Bilateral ~2002  . Cataract extraction w/ intraocular lens  implant, bilateral Bilateral 1990's      Social History:  reports that she quit smoking about 48 years ago. Her smoking use included Cigarettes. She has a 30 pack-year smoking history. She has never used smokeless tobacco. She reports that she drinks alcohol. She reports that she does not use illicit drugs.    Allergies  Allergen Reactions  . Morphine And Related     On Nursing Home MAR, no reaction documented  . Penicillins     On Nursing Home MAR, no reaction documented     History reviewed. No pertinent family history.  Prior to Admission medications   Medication Sig Start Date End Date Taking? Authorizing Provider  acetaminophen (Q-NOL) 325 MG tablet Take 325 mg by mouth 2 (two) times daily.   Yes Historical Provider, MD  celecoxib (CELEBREX) 200 MG capsule Take 200 mg by mouth 2 (two) times  daily.   Yes Historical Provider, MD  citalopram (CELEXA) 20 MG tablet Take 20 mg by mouth daily.   Yes Historical Provider, MD  diclofenac sodium (VOLTAREN) 1 % GEL Apply 4 g topically every 8 (eight) hours. Bilateral knees.   Yes Historical Provider, MD  fluticasone (FLOVENT HFA) 220 MCG/ACT inhaler Inhale 1 puff into the lungs 2 (two) times daily. 11/19/12  Yes Nishant Dhungel, MD  HYDROcodone-acetaminophen (NORCO/VICODIN) 5-325 MG per tablet Take 1 tablet by mouth every 6 (six) hours. Severe bilateral knee pain.   Yes Historical Provider, MD  LORazepam (ATIVAN) 0.5 MG tablet Take 0.25 mg by mouth every 4 (four) hours as needed for anxiety. Do not exceed 1.5 mg /24h   Yes Historical Provider, MD  ondansetron (ZOFRAN) 4 MG tablet Take 4 mg by mouth every 6 (six) hours as needed for nausea or vomiting.   Yes Historical Provider, MD  oseltamivir (TAMIFLU) 75 MG capsule Take 75 mg by mouth 2 (two) times daily. For 5 days.   Yes Historical Provider, MD  risperiDONE (RISPERDAL) 0.25 MG tablet Take 0.25 mg by mouth daily.   Yes Historical Provider, MD  sulfamethoxazole-trimethoprim (BACTRIM DS) 800-160 MG per tablet Take 1 tablet by mouth 2 (two) times daily. 02/08/13   Historical Provider, MD     Physical Exam: Filed Vitals:   02/15/13 1529 02/15/13 1530 02/15/13 1732 02/15/13 1800  BP:  124/87  162/71  Pulse:  87 81   Temp:  97.3 F (36.3 C) 98.7 F (37.1 C)   TempSrc:  Oral Rectal   Resp:  18 19 16   SpO2: 96% 94% 93%      Constitutional: Vital signs reviewed. Patient is a well-developed and well-nourished in no acute distress and cooperative with exam. Alert and oriented x3.  Head: Normocephalic and atraumatic  Ear: TM normal bilaterally  Mouth: no erythema or exudates, MMM  Eyes: PERRL, EOMI, conjunctivae normal, No scleral icterus.  Neck: Supple, Trachea midline normal ROM, No JVD, mass, thyromegaly, or carotid bruit present.  Cardiovascular: RRR, S1 normal, S2 normal, no MRG, pulses  symmetric and intact bilaterally  Pulmonary/Chest: CTAB, no wheezes, rales, or rhonchi  Abdominal: Soft. Non-tender, non-distended, bowel sounds are normal, no masses, organomegaly, or guarding present.  GU: no CVA tenderness Musculoskeletal: No joint deformities, erythema, or stiffness, ROM full and no nontender Ext: no edema and no cyanosis, pulses palpable bilaterally (DP and PT)  Hematology: no cervical, inginal, or axillary adenopathy.  Neurological: A&O x3, Strenght is normal and symmetric bilaterally, cranial nerve II-XII are grossly intact, no focal motor deficit, sensory intact to light touch bilaterally.  Skin: Warm, dry and intact. No rash, cyanosis, or clubbing.  Psychiatric: Normal mood and affect. speech and behavior is normal. Judgment and thought content normal. Cognition and memory are normal.       Labs on Admission:    Basic Metabolic Panel:  Recent Labs Lab 02/15/13 1635  NA 120*  K 5.0  CL 85*  CO2 22  GLUCOSE 107*  BUN 18  CREATININE 1.32*  CALCIUM 8.6   Liver Function Tests: No results found for this basename: AST, ALT, ALKPHOS, BILITOT, PROT, ALBUMIN,  in the last 168 hours No  results found for this basename: LIPASE, AMYLASE,  in the last 168 hours No results found for this basename: AMMONIA,  in the last 168 hours CBC:  Recent Labs Lab 02/15/13 1635  WBC 5.1  NEUTROABS 3.1  HGB 10.2*  HCT 29.2*  MCV 86.1  PLT 261   Cardiac Enzymes: No results found for this basename: CKTOTAL, CKMB, CKMBINDEX, TROPONINI,  in the last 168 hours  BNP (last 3 results)  Recent Labs  11/18/12 1600  PROBNP 530.7*      CBG:  Recent Labs Lab 02/15/13 1601  GLUCAP 118*    Radiological Exams on Admission: Dg Chest 2 View  02/15/2013   CLINICAL DATA:  Cough, COPD, possible infiltrate  EXAM: CHEST  2 VIEW  COMPARISON:  11/18/2012  FINDINGS: Cardiomediastinal silhouette is stable. Atherosclerotic calcifications of thoracic aorta again noted. No acute  infiltrate or pleural effusion no pulmonary edema. Stable compression deformity lower thoracic spine.  IMPRESSION: No active cardiopulmonary disease.   Electronically Signed   By: Natasha Mead M.D.   On: 02/15/2013 17:08    EKG: Independently reviewed.   Assessment/Plan Active Problems:   Hyponatremia   Altered mental status   Altered mental status/metabolic encephalopathy  Likely secondary to hyponatremia This is likely secondary to dehydration No focal deficits to suggest stroke therefore no CT scan of the head is being done Patient is also on Q4 Ativan when necessary, risperidone Recent UTI but UA is negative today Chest x-ray negative Being ruled out for the flu been Continued Tamiflu empirically  for the fever, discontinue if negative PCR   Dementia Will hold Celexa given hyponatremia Continue risperidone Minimize benzodiazepines   Normocytic anemia Baseline is around 10 Order anemia panel, thyroid function, Given her DO NOT RESUSCITATE status, I doubt that aggressive workup like colonoscopy is indicated    Code Status:   DO NOT RESUSCITATE Family Communication: bedside Disposition Plan: admit   Time spent: 70 mins   [facility] Pager [phone]  If 7PM-7AM, please contact night-coverage [email or web address] Password [facility] 02/15/2013, 6:24 PM

## 2013-02-15 NOTE — Progress Notes (Signed)
PHARMACIST - PHYSICIAN COMMUNICATION DR: Susie CassetteAbrol CONCERNING:  Oseltamivir 30 mg capsule shortage  DESCRIPTION:  [name] is experiencing a significant shortage of Oseltamivir 30mg  capsules.    This patient has an order for Oseltamivir (Tamiflu) and has a Cr Clearance ~ 34 ml/min (normalized). The package insert for Oseltamivir recommends 30mg  BID x 5 days for patients with CrCl 30-60 ml/min.  To preserve an adequate supply of 30mg  capsules for our patients with more significant renal impairment the Infectious Disease team has recommended to substitute Oseltamivir 75mg  qday x 5 days for patients with CrCl 30-60 ml/min at this time.   RECOMMENDATION: Oseltamivir 75mg  PO DAILY x 5 days has been substituted for your patient. If you have any questions about this temporary substitution please feel free to call the Pharmacy at [phone] for assistance.  Otho BellowsGreen, Jazelyn Sipe L PharmD Pager [phone] 02/15/2013, 6:31 PM

## 2013-02-15 NOTE — ED Notes (Addendum)
Initial Contact - pt resting on stretcher, dtr at bs.  Pt changed to hospital gown, placed to cardiac/02 monitor.  Pt denies needs.  Sts "i'm tired".  Per dtr, pt with inc fatigue, confusion x1 mo.  Per dtr, pt with recent tx for UTI, last dose abx today.  Dtr reports last seen x3 days ago well.  Hx dementia, "but it's getting worse".  Neuros grossly intact.  MAEI, weak.  +csm/+pulses.  Speaking full/clear sentences, lsctab, poor effort.  No cough noted.  +bsx4 quads.  abd s/nt/nd, obese.  Pt denies CP/SOB, n/v/d/c.  Skin PWD.

## 2013-02-16 DIAGNOSIS — D649 Anemia, unspecified: Secondary | ICD-10-CM

## 2013-02-16 DIAGNOSIS — R4182 Altered mental status, unspecified: Secondary | ICD-10-CM

## 2013-02-16 DIAGNOSIS — G309 Alzheimer's disease, unspecified: Secondary | ICD-10-CM

## 2013-02-16 DIAGNOSIS — F0280 Dementia in other diseases classified elsewhere without behavioral disturbance: Secondary | ICD-10-CM

## 2013-02-16 LAB — CBC
HCT: 26.2 % — ABNORMAL LOW (ref 36.0–46.0)
Hemoglobin: 8.9 g/dL — ABNORMAL LOW (ref 12.0–15.0)
MCH: 29.7 pg (ref 26.0–34.0)
MCHC: 34 g/dL (ref 30.0–36.0)
MCV: 87.3 fL (ref 78.0–100.0)
PLATELETS: 199 10*3/uL (ref 150–400)
RBC: 3 MIL/uL — ABNORMAL LOW (ref 3.87–5.11)
RDW: 12.8 % (ref 11.5–15.5)
WBC: 4.9 10*3/uL (ref 4.0–10.5)

## 2013-02-16 LAB — BASIC METABOLIC PANEL
BUN: 12 mg/dL (ref 6–23)
CO2: 21 mEq/L (ref 19–32)
Calcium: 8.1 mg/dL — ABNORMAL LOW (ref 8.4–10.5)
Chloride: 94 mEq/L — ABNORMAL LOW (ref 96–112)
Creatinine, Ser: 1.05 mg/dL (ref 0.50–1.10)
GFR calc Af Amer: 54 mL/min — ABNORMAL LOW (ref >90)
GFR, EST NON AFRICAN AMERICAN: 47 mL/min — AB (ref >90)
GLUCOSE: 98 mg/dL (ref 70–99)
Potassium: 4.5 mEq/L (ref 3.7–5.3)
Sodium: 127 mEq/L — ABNORMAL LOW (ref 137–147)

## 2013-02-16 LAB — MRSA PCR SCREENING: MRSA by PCR: NEGATIVE

## 2013-02-16 LAB — OSMOLALITY, URINE: Osmolality, Ur: 275 mOsm/kg — ABNORMAL LOW (ref 390–1090)

## 2013-02-16 LAB — HEMOGLOBIN A1C
Hgb A1c MFr Bld: 5.7 % — ABNORMAL HIGH (ref <5.7)
Mean Plasma Glucose: 117 mg/dL — ABNORMAL HIGH (ref <117)

## 2013-02-16 LAB — COMPREHENSIVE METABOLIC PANEL
ALT: 11 U/L (ref 0–35)
AST: 16 U/L (ref 0–37)
Albumin: 3.2 g/dL — ABNORMAL LOW (ref 3.5–5.2)
Alkaline Phosphatase: 55 U/L (ref 39–117)
BILIRUBIN TOTAL: 0.6 mg/dL (ref 0.3–1.2)
BUN: 14 mg/dL (ref 6–23)
CHLORIDE: 94 meq/L — AB (ref 96–112)
CO2: 20 mEq/L (ref 19–32)
Calcium: 7.8 mg/dL — ABNORMAL LOW (ref 8.4–10.5)
Creatinine, Ser: 1.19 mg/dL — ABNORMAL HIGH (ref 0.50–1.10)
GFR calc non Af Amer: 40 mL/min — ABNORMAL LOW (ref >90)
GFR, EST AFRICAN AMERICAN: 47 mL/min — AB (ref >90)
GLUCOSE: 100 mg/dL — AB (ref 70–99)
POTASSIUM: 4.6 meq/L (ref 3.7–5.3)
Sodium: 126 mEq/L — ABNORMAL LOW (ref 137–147)
TOTAL PROTEIN: 6 g/dL (ref 6.0–8.3)

## 2013-02-16 LAB — RETICULOCYTES
RBC.: 3.23 MIL/uL — AB (ref 3.87–5.11)
RETIC CT PCT: 1.5 % (ref 0.4–3.1)
Retic Count, Absolute: 48.5 10*3/uL (ref 19.0–186.0)

## 2013-02-16 LAB — URINE CULTURE
CULTURE: NO GROWTH
Colony Count: NO GROWTH

## 2013-02-16 LAB — TSH: TSH: 0.877 u[IU]/mL (ref 0.350–4.500)

## 2013-02-16 LAB — IRON AND TIBC
Iron: 38 ug/dL — ABNORMAL LOW (ref 42–135)
Saturation Ratios: 18 % — ABNORMAL LOW (ref 20–55)
TIBC: 216 ug/dL — ABNORMAL LOW (ref 250–470)
UIBC: 178 ug/dL (ref 125–400)

## 2013-02-16 LAB — CREATININE, URINE, RANDOM: CREATININE, URINE: 40.13 mg/dL

## 2013-02-16 LAB — OSMOLALITY: OSMOLALITY: 257 mosm/kg — AB (ref 275–300)

## 2013-02-16 LAB — FOLATE: Folate: 10.5 ng/mL

## 2013-02-16 LAB — CORTISOL: Cortisol, Plasma: 6.2 ug/dL

## 2013-02-16 LAB — VITAMIN B12: VITAMIN B 12: 406 pg/mL (ref 211–911)

## 2013-02-16 LAB — FERRITIN: Ferritin: 180 ng/mL (ref 10–291)

## 2013-02-16 LAB — SODIUM, URINE, RANDOM: SODIUM UR: 65 meq/L

## 2013-02-16 MED ORDER — FERROUS SULFATE 325 (65 FE) MG PO TABS
325.0000 mg | ORAL_TABLET | Freq: Two times a day (BID) | ORAL | Status: DC
  Administered 2013-02-17 – 2013-02-19 (×5): 325 mg via ORAL
  Filled 2013-02-16 (×7): qty 1

## 2013-02-16 MED ORDER — SODIUM CHLORIDE 0.9 % IV SOLN
INTRAVENOUS | Status: DC
  Administered 2013-02-16 – 2013-02-18 (×4): via INTRAVENOUS

## 2013-02-16 NOTE — Progress Notes (Signed)
[facility] PROGRESS NOTE  [facility] [name] ZOX:[id number] DOB: [birth year] DOA: 02/15/2013 PCP: MAZZOCCHI, Rise Mu, MD  80 year old female from [facility] comes in with generalized weakness, altered mental status, decreased by mouth intake for the last 24 hours. History is provided by the daughter.   She was recently treated for UTI with Bactrim. She states that the past month she has had increased number of falls and has fallen 6 times in the past 3 days. There is no history of head injury.She is incontinent of urine at baseline and wears diapers.    Assessment/Plan: Acute Metabolic Encephalopathy -due to hyponatremia which is secondary to dehydration -No focal deficits to suggest stroke therefore no CT scan of the head is being done  -UA is negative for pyuria -influenza PCR is negative -d/c tamiflu -TSH--0.877, serum B12--406 -there may be a component of SIADH as sOsm<uOsm Hyponatremia -slowly improving -continue NS -serial BMPs -hold Celexa Acute on Chronic Renal Failure (CKD2-3) -improving with IVF -baseline creatinine 0.9-1.0 Iron deficiency anemia -iron saturation 18% -start ferrous sulfate Dementia -continue risperidone -minimize hypnotics   Family Communication:   Daughter updated at beside Disposition Plan:  ALF in 1-2 days       Procedures/Studies: Dg Chest 2 View  02/15/2013   CLINICAL DATA:  Cough, COPD, possible infiltrate  EXAM: CHEST  2 VIEW  COMPARISON:  11/18/2012  FINDINGS: Cardiomediastinal silhouette is stable. Atherosclerotic calcifications of thoracic aorta again noted. No acute infiltrate or pleural effusion no pulmonary edema. Stable compression deformity lower thoracic spine.  IMPRESSION: No active cardiopulmonary disease.   Electronically Signed   By: Natasha Mead M.D.   On: 02/15/2013 17:08         Subjective: Pt is pleasantly confused.  Denies f/c, cp, sob, n/v/d, abdominal pain  Objective: Filed Vitals:   02/15/13 2133 02/16/13 0558 02/16/13 1437 02/16/13 2049  BP:  151/67 145/56 124/79  Pulse:  78 81 87  Temp:  98.3 F (36.8 C) 98.4 F (36.9 C) 99 F (37.2 C)  TempSrc:  Oral Oral Oral  Resp:  18 18 18   Height:      Weight:      SpO2: 95% 97% 96% 95%    Intake/Output Summary (Last 24 hours) at 02/16/13 2050 Last data filed at 02/16/13 1900  Gross per 24 hour  Intake   2105 ml  Output     15 ml  Net   2090 ml   Weight change:  Exam:   General:  Pt is alert, follows commands appropriately, not in acute distress  HEENT: No icterus, No thrush,  [place]/AT  Cardiovascular: RRR, S1/S2, no rubs, no gallops  Respiratory: CTA bilaterally, no wheezing, no crackles, no rhonchi  Abdomen: Soft/+BS, non tender, non distended, no guarding  Extremities: 1+LE edema, No lymphangitis, No petechiae, No rashes, no synovitis  Data Reviewed: Basic Metabolic Panel:  Recent Labs Lab 02/15/13 1635 02/16/13 0400 02/16/13 1550  NA 120* 126* 127*  K 5.0 4.6 4.5  CL 85* 94* 94*  CO2 22 20 21   GLUCOSE 107* 100* 98  BUN 18 14 12   CREATININE 1.32* 1.19* 1.05  CALCIUM 8.6 7.8* 8.1*   Liver Function Tests:  Recent Labs Lab 02/15/13 1855 02/16/13 0400  AST 16 16  ALT 13 11  ALKPHOS 62 55  BILITOT 0.5 0.6  PROT 7.0 6.0  ALBUMIN 3.7 3.2*   No results found for this basename: LIPASE, AMYLASE,  in the last 168 hours No results found for this  basename: AMMONIA,  in the last 168 hours CBC:  Recent Labs Lab 02/15/13 1635 02/15/13 1855 02/16/13 0400  WBC 5.1 4.4 4.9  NEUTROABS 3.1  --   --   HGB 10.2* 9.4* 8.9*  HCT 29.2* 27.5* 26.2*  MCV 86.1 86.8 87.3  PLT 261 223 199   Cardiac Enzymes: No results found for this basename: CKTOTAL, CKMB, CKMBINDEX, TROPONINI,  in the last 168 hours BNP: No components found with this basename: POCBNP,  CBG:  Recent Labs Lab 02/15/13 1601  GLUCAP 118*    Recent Results (from the past 240 hour(s))  URINE CULTURE     Status: None    Collection Time    02/15/13  4:24 PM      Result Value Range Status   Specimen Description URINE, CATHETERIZED   Final   Special Requests NONE   Final   Culture  Setup Time     Final   Value: 02/15/2013 22:32     Performed at [facility] Count     Final   Value: NO GROWTH     Performed at [facility]   Culture     Final   Value: NO GROWTH     Performed at [facility]   Report Status 02/16/2013 FINAL   Final  CULTURE, BLOOD (ROUTINE X 2)     Status: None   Collection Time    02/15/13  4:35 PM      Result Value Range Status   Specimen Description BLOOD LAC   Final   Special Requests BOTTLES DRAWN AEROBIC AND ANAEROBIC   Final   Culture  Setup Time     Final   Value: 02/15/2013 20:37     Performed at [facility]   Culture     Final   Value:        BLOOD CULTURE RECEIVED NO GROWTH TO DATE CULTURE WILL BE HELD FOR 5 DAYS BEFORE ISSUING A FINAL NEGATIVE REPORT     Performed at [facility]   Report Status PENDING   Incomplete  CULTURE, BLOOD (ROUTINE X 2)     Status: None   Collection Time    02/15/13  4:55 PM      Result Value Range Status   Specimen Description BLOOD LEFT WRIST   Final   Special Requests BOTTLES DRAWN AEROBIC AND ANAEROBIC   Final   Culture  Setup Time     Final   Value: 02/15/2013 20:39     Performed at [facility]   Culture     Final   Value:        BLOOD CULTURE RECEIVED NO GROWTH TO DATE CULTURE WILL BE HELD FOR 5 DAYS BEFORE ISSUING A FINAL NEGATIVE REPORT     Performed at [facility]   Report Status PENDING   Incomplete  MRSA PCR SCREENING     Status: None   Collection Time    02/15/13 10:56 PM      Result Value Range Status   MRSA by PCR NEGATIVE  NEGATIVE Final   Comment:            The GeneXpert MRSA Assay (FDA     approved for NASAL specimens     only), is one component of a     comprehensive MRSA colonization     surveillance program. It is not     intended to  diagnose MRSA  infection nor to guide or     monitor treatment for     MRSA infections.     Scheduled Meds: . enoxaparin (LOVENOX) injection  40 mg Subcutaneous Q24H  . fluticasone  1 puff Inhalation BID  . sodium chloride  3 mL Intravenous Q12H   Continuous Infusions: . sodium chloride 75 mL/hr at 02/16/13 1725     Tonny Isensee, DO  [facility] Pager [phone]  If 7PM-7AM, please contact night-coverage [email or web address] Password [email or web address] 02/16/2013, 8:50 PM   LOS: 1 day

## 2013-02-16 NOTE — Progress Notes (Addendum)
RN is presently in the patient's room to observe the patient. Daughter of the patient has called the RN to the patient's room on numerous occasions to c/o  patient constantly pulling at her PIVs and trying to pull the Kerlix (wrapped around the PIV s) off the arms. RN was in the room and observed that the patient touched the Kerlix  but certainly did not try to pull out the PIV s. The patient was fairly calm with very mild bilateral hand tremors. Will continue to monitor patient.

## 2013-02-16 NOTE — Progress Notes (Signed)
Clinical Social Work Department BRIEF PSYCHOSOCIAL ASSESSMENT 02/16/2013  Patient:  Meagan [name],Meagan [name]     Account Number:  [id number]     Admit date:  02/15/2013  Clinical Social Worker:  Orpah GreekFOLEY,Keigan Tafoya, LCSWA  Date/Time:  02/16/2013 01:48 PM  Referred by:  Physician  Date Referred:  02/16/2013 Referred for  Other - See comment   Other Referral:   Admitted from: [facility]   Interview type:  Family Other interview type:   patient's daughter, Meagan MccreedyBarbara via phone    PSYCHOSOCIAL DATA Living Status:  FACILITY Admitted from facility:  [facility] Level of care:  Assisted Living Primary support name:  Meagan [name] (daughter) h#: [phone] c#: [phone] Primary support relationship to patient:  CHILD, ADULT Degree of support available:   good    CURRENT CONCERNS Current Concerns  Post-Acute Placement   Other Concerns:    SOCIAL WORK ASSESSMENT / PLAN CSW received consult that patient was [facility].   Assessment/plan status:  Information/Referral to [facility] Other assessment/ plan:   Information/referral to community resources:   CSW completed FL2 and sent information to [facility], confirmed with Meagan [name] @ ALF that they would be able to take patient back when stable.    PATIENT'S/FAMILY'S RESPONSE TO PLAN OF CARE: Patient's daughter informed CSW that [place] [facility] but [facility] in February 2014, patient's husband passed away at the end of April 2014 and the daughter hired a caregiver, Meagan [name] to stay with her 6 hours during the day (9:30a - 3:30p).    Per the daughter, she has taken a sudden decline likely due to her alzheimers, she has not been using silverware and reverting back to finger foods. According to the daughter, the patient would never touch her food with her fingers, "not even a chicken wing". Daughter states that when she does  return to [facility], they will keep a closer look at what she drinks to make sure she does not get dehydrated again.       Meagan MaxinKelly Michaele Amundson, LCSW [facility] Clinical Social Worker cell #: [phone]

## 2013-02-16 NOTE — Progress Notes (Addendum)
Patient's daughter still c/o patient "constantly moving and fidgeting". RN observed that the patient moves a little in the bed but has been relatively calm and quiet since she was admitted onto [address]. Will continue to monitor the patient.

## 2013-02-17 LAB — BASIC METABOLIC PANEL
BUN: 12 mg/dL (ref 6–23)
CALCIUM: 8 mg/dL — AB (ref 8.4–10.5)
CO2: 23 meq/L (ref 19–32)
Chloride: 97 mEq/L (ref 96–112)
Creatinine, Ser: 1.03 mg/dL (ref 0.50–1.10)
GFR calc Af Amer: 55 mL/min — ABNORMAL LOW (ref >90)
GFR calc non Af Amer: 48 mL/min — ABNORMAL LOW (ref >90)
Glucose, Bld: 98 mg/dL (ref 70–99)
Potassium: 4.9 mEq/L (ref 3.7–5.3)
SODIUM: 129 meq/L — AB (ref 137–147)

## 2013-02-17 NOTE — Evaluation (Addendum)
Physical Therapy Evaluation Patient Details Name: Meagan [name] MRN: [id number] DOB: 1924/03/14 Today's Date: 02/17/2013 Time: 7829-5621 PT Time Calculation (min): 18 min  PT Assessment / Plan / Recommendation History of Present Illness  80 yo female admitted with metabolic encephalopathy, AMS, weakness, FTT. Hx of dementia. Pt is from ALF  Clinical Impression  On eval, pt required Max assist for bed mobility,Min assist to walk ~15 feet with walker. Max encouragement for pt participation. Aide present and assisted as well. Unsure of d/c plan at the [facility] stated MD will discuss this with daughter. Recommend return to ALF with 24/7 care as long as aide and facility can manage pt at current level. Otherwise, pt may need SNF placement.     PT Assessment  Patient needs continued PT services    Follow Up Recommendations  Supervision/Assistance - 24 hour;Home health PT (return to ALF as long as aide/facility can provide current level of care) If not, then may need to consider SNF.     Does the patient have the potential to tolerate intense rehabilitation      Barriers to Discharge        Equipment Recommendations  None recommended by PT. Aide stated daughter is in process of getting wheelchair for pt.     Recommendations for Other Services     Frequency Min 2X/week    Precautions / Restrictions Precautions Precautions: Fall Restrictions Weight Bearing Restrictions: No   Pertinent Vitals/Pain Knees L worse than R-pt unable to rate      Mobility  Bed Mobility Overal bed mobility: Needs Assistance Bed Mobility: Supine to Sit;Sit to Supine Supine to sit: Max assist Sit to supine: Max assist General bed mobility comments: Assist for trunk to upright and LEs back onto bed. Increased time. Max encouragment for pt to participate.  Transfers Overall transfer level: Needs assistance Transfers: Sit to/from Stand Sit to Stand: Mod assist;From elevated surface General transfer  comment: Assist to rise, stabillize, control descent. 2 attempts to get to standing Ambulation/Gait Ambulation/Gait assistance: Min assist Ambulation Distance (Feet): 15 Feet Assistive device: Rolling walker (2 wheeled) General Gait Details: Assist to stabilize throughout ambulation. slow gait speed. Max encouragement for pt to ambulate around bed. Pt declined to ambulate any farther.     Exercises     PT Diagnosis: Difficulty walking;Generalized weakness;Altered mental status  PT Problem List: Decreased strength;Decreased activity tolerance;Decreased mobility;Decreased balance;Decreased cognition;Pain PT Treatment Interventions: Gait training;Functional mobility training;Therapeutic activities;Therapeutic exercise;Patient/family education     PT Goals(Current goals can be found in the care plan section) Acute Rehab PT Goals Patient Stated Goal: back to ALF per aide (will discuss with daugher) PT Goal Formulation: Patient unable to participate in goal setting Time For Goal Achievement: 03/03/13 Potential to Achieve Goals: Fair  Visit Information  Last PT Received On: 02/17/13 Assistance Needed: +1 History of Present Illness: 80 yo female admitted with metabolic encephalopathy, AMS, weakness, FTT. Hx of dementia. Pt is from ALF       Prior Functioning  Home Living Family/patient expects to be discharged to:: Assisted living Type of Home: Apartment Home Layout: One level Home Equipment: Walker - 4 wheels Prior Function Level of Independence: Needs assistance Comments: has aide daily 9-3.  Communication Communication: No difficulties    Cognition  Cognition Arousal/Alertness: Awake/alert Behavior During Therapy: WFL for tasks assessed/performed Overall Cognitive Status: History of cognitive impairments - at baseline    Extremity/Trunk Assessment Upper Extremity Assessment Upper Extremity Assessment: Generalized weakness Lower Extremity Assessment Lower Extremity  Assessment:  RLE deficits/detail;LLE deficits/detail RLE Deficits / Details: chronic knee pain LLE Deficits / Details: chronic knee pain Cervical / Trunk Assessment Cervical / Trunk Assessment: Normal   Balance    End of Session PT - End of Session Equipment Utilized During Treatment: Gait belt Activity Tolerance: Patient limited by pain;Patient limited by fatigue Patient left: in bed;with call bell/phone within reach;with bed alarm set;with family/visitor present Nurse Communication: Mobility status  GP     Meagan [name] AlertJannie Jacquelinne [name], MPT Pager: [phone]

## 2013-02-17 NOTE — Progress Notes (Signed)
PROGRESS NOTE  Meagan SaleSybil Brumley ZOX:[id number] DOB: 02/28/1924 DOA: 02/15/2013 PCP: MAZZOCCHI, Rise MuANNMARIE, MD  HPI: 80 year old female from [facility] assisted living comes in with generalized weakness, altered mental status, decreased by mouth intake for the last 24 hours. History is provided by the daughter. She was recently treated for UTI with Bactrim. She states that the past month she has had increased number of falls and has fallen 6 times in the past 3 days. There is no history of head injury.She is incontinent of urine at baseline and wears diapers.   Assessment/Plan: Acute Metabolic Encephalopathy  -due to hyponatremia which is secondary to dehydration, now improved, at baseline per sitter.   -No focal deficits to suggest stroke therefore no CT scan of the head is being done  -UA is negative for pyuria  -influenza PCR is negative  -d/c tamiflu  -TSH--0.877, serum B12--406  -there may be a component of SIADH as sOsm<uOsm  Hyponatremia  - slowly improving, Na 129 this morning - continue NS  - serial BMPs  - hold Celexa  Acute on Chronic Renal Failure (CKD2-3)  - improving with IVF  - baseline creatinine 0.9-1.0  Iron deficiency anemia  - iron saturation 18%  - start ferrous sulfate  Dementia  - continue risperidone  - minimize hypnotics   Diet: heart Fluids: NS DVT Prophylaxis: Lovenox  Code Status: DNR Family Communication: none  Disposition Plan: ALF 1 day  Consultants:  none  Procedures:  none   Antibiotics  Anti-infectives   Start     Dose/Rate Route Frequency Ordered Stop   02/15/13 2200  oseltamivir (TAMIFLU) capsule 75 mg  Status:  Discontinued     75 mg Oral 2 times daily 02/15/13 1824 02/15/13 1832   02/15/13 1900  oseltamivir (TAMIFLU) capsule 75 mg  Status:  Discontinued     75 mg Oral Daily 02/15/13 1832 02/16/13 1013     Antibiotics Given (last 72 hours)   Date/Time Action Medication Dose   02/15/13 2240 Given   oseltamivir  (TAMIFLU) capsule 75 mg 75 mg      HPI/Subjective: - no complaints, alert to person only.   Objective: Filed Vitals:   02/16/13 0558 02/16/13 1437 02/16/13 2049 02/17/13 0503  BP: 151/67 145/56 124/79 153/77  Pulse: 78 81 87 74  Temp: 98.3 F (36.8 C) 98.4 F (36.9 C) 99 F (37.2 C) 98.6 F (37 C)  TempSrc: Oral Oral Oral Oral  Resp: 18 18 18 16   Height:      Weight:      SpO2: 97% 96% 95% 97%    Intake/Output Summary (Last 24 hours) at 02/17/13 1006 Last data filed at 02/17/13 0630  Gross per 24 hour  Intake 2067.5 ml  Output      0 ml  Net 2067.5 ml   Filed Weights   02/15/13 2000  Weight: 78.4 kg (172 lb 13.5 oz)    Exam:   General:  NAD  Cardiovascular: regular rate and rhythm, without MRG  Respiratory: good air movement, clear to auscultation throughout, no wheezing, ronchi or rales  Abdomen: soft, not tender to palpation, positive bowel sounds  MSK: no peripheral edema  Neuro: non focal  Data Reviewed: Basic Metabolic Panel:  Recent Labs Lab 02/15/13 1635 02/16/13 0400 02/16/13 1550 02/17/13 0500  NA 120* 126* 127* 129*  K 5.0 4.6 4.5 4.9  CL 85* 94* 94* 97  CO2 22 20 21 23   GLUCOSE 107* 100* 98 98  BUN 18 14 12  12  CREATININE 1.32* 1.19* 1.05 1.03  CALCIUM 8.6 7.8* 8.1* 8.0*   Liver Function Tests:  Recent Labs Lab 02/15/13 1855 02/16/13 0400  AST 16 16  ALT 13 11  ALKPHOS 62 55  BILITOT 0.5 0.6  PROT 7.0 6.0  ALBUMIN 3.7 3.2*   CBC:  Recent Labs Lab 02/15/13 1635 02/15/13 1855 02/16/13 0400  WBC 5.1 4.4 4.9  NEUTROABS 3.1  --   --   HGB 10.2* 9.4* 8.9*  HCT 29.2* 27.5* 26.2*  MCV 86.1 86.8 87.3  PLT 261 223 199   BNP (last 3 results)  Recent Labs  11/18/12 1600  PROBNP 530.7*   CBG:  Recent Labs Lab 02/15/13 1601  GLUCAP 118*    Recent Results (from the past 240 hour(s))  URINE CULTURE     Status: None   Collection Time    02/15/13  4:24 PM      Result Value Range Status   Specimen Description  URINE, CATHETERIZED   Final   Special Requests NONE   Final   Culture  Setup Time     Final   Value: 02/15/2013 22:32     Performed at [facility] Count     Final   Value: NO GROWTH     Performed at [facility]   Culture     Final   Value: NO GROWTH     Performed at [facility]   Report Status 02/16/2013 FINAL   Final  CULTURE, BLOOD (ROUTINE X 2)     Status: None   Collection Time    02/15/13  4:35 PM      Result Value Range Status   Specimen Description BLOOD LAC   Final   Special Requests BOTTLES DRAWN AEROBIC AND ANAEROBIC   Final   Culture  Setup Time     Final   Value: 02/15/2013 20:37     Performed at [facility]   Culture     Final   Value:        BLOOD CULTURE RECEIVED NO GROWTH TO DATE CULTURE WILL BE HELD FOR 5 DAYS BEFORE ISSUING A FINAL NEGATIVE REPORT     Performed at [facility]   Report Status PENDING   Incomplete  CULTURE, BLOOD (ROUTINE X 2)     Status: None   Collection Time    02/15/13  4:55 PM      Result Value Range Status   Specimen Description BLOOD LEFT WRIST   Final   Special Requests BOTTLES DRAWN AEROBIC AND ANAEROBIC   Final   Culture  Setup Time     Final   Value: 02/15/2013 20:39     Performed at [facility]   Culture     Final   Value:        BLOOD CULTURE RECEIVED NO GROWTH TO DATE CULTURE WILL BE HELD FOR 5 DAYS BEFORE ISSUING A FINAL NEGATIVE REPORT     Performed at [facility]   Report Status PENDING   Incomplete  MRSA PCR SCREENING     Status: None   Collection Time    02/15/13 10:56 PM      Result Value Range Status   MRSA by PCR NEGATIVE  NEGATIVE Final   Comment:            The GeneXpert MRSA Assay (FDA     approved for NASAL specimens     only), is  one component of a     comprehensive MRSA colonization     surveillance program. It is not     intended to diagnose MRSA     infection nor to guide or     monitor treatment for     MRSA infections.       Studies: Dg Chest 2 View  02/15/2013   CLINICAL DATA:  Cough, COPD, possible infiltrate  EXAM: CHEST  2 VIEW  COMPARISON:  11/18/2012  FINDINGS: Cardiomediastinal silhouette is stable. Atherosclerotic calcifications of thoracic aorta again noted. No acute infiltrate or pleural effusion no pulmonary edema. Stable compression deformity lower thoracic spine.  IMPRESSION: No active cardiopulmonary disease.   Electronically Signed   By: Natasha Mead M.D.   On: 02/15/2013 17:08    Scheduled Meds: . enoxaparin (LOVENOX) injection  40 mg Subcutaneous Q24H  . ferrous sulfate  325 mg Oral BID WC  . fluticasone  1 puff Inhalation BID  . sodium chloride  3 mL Intravenous Q12H   Continuous Infusions: . sodium chloride 75 mL/hr at 02/16/13 1725    Active Problems:   Hyponatremia   Altered mental status   Time spent: 25  Pamella Pert, MD [facility] Pager [phone]. If 7 PM - 7 AM, please contact night-coverage at [email or web address], password [facility] 02/17/2013, 10:06 AM  LOS: 2 days

## 2013-02-17 NOTE — Progress Notes (Signed)
Per pt's personal sitter, pt only had a few bites of food at breakfast and at lunch. Daughter is concerned that pt is not eating and wants MD to be aware. MD will be made aware. Pt given an Ensure at this time to try and get some nutrition in her today. Will order pt supper and encourage her to eat and offer assistance as needed. Will continue to monitor pt's oral intake.   Arta BruceDeutsch, Vinal Rosengrant [facility] 02/17/2013 2:24 PM

## 2013-02-18 LAB — CBC
HEMATOCRIT: 28.6 % — AB (ref 36.0–46.0)
HEMOGLOBIN: 9.5 g/dL — AB (ref 12.0–15.0)
MCH: 29.4 pg (ref 26.0–34.0)
MCHC: 33.2 g/dL (ref 30.0–36.0)
MCV: 88.5 fL (ref 78.0–100.0)
Platelets: 226 10*3/uL (ref 150–400)
RBC: 3.23 MIL/uL — ABNORMAL LOW (ref 3.87–5.11)
RDW: 12.7 % (ref 11.5–15.5)
WBC: 7.4 10*3/uL (ref 4.0–10.5)

## 2013-02-18 LAB — BASIC METABOLIC PANEL
BUN: 11 mg/dL (ref 6–23)
CHLORIDE: 96 meq/L (ref 96–112)
CO2: 20 meq/L (ref 19–32)
Calcium: 7.9 mg/dL — ABNORMAL LOW (ref 8.4–10.5)
Creatinine, Ser: 0.85 mg/dL (ref 0.50–1.10)
GFR calc Af Amer: 70 mL/min — ABNORMAL LOW (ref >90)
GFR, EST NON AFRICAN AMERICAN: 60 mL/min — AB (ref >90)
GLUCOSE: 103 mg/dL — AB (ref 70–99)
Potassium: 4 mEq/L (ref 3.7–5.3)
Sodium: 128 mEq/L — ABNORMAL LOW (ref 137–147)

## 2013-02-18 NOTE — Progress Notes (Signed)
PROGRESS NOTE  Keyra Virella MVH:[id number] DOB: [birth year] DOA: 02/15/2013 PCP: MAZZOCCHI, Rise Mu, MD  Assessment/Plan: Acute Metabolic Encephalopathy  -due to hyponatremia which is secondary to dehydration, now improved, at baseline per sitter. Daughter to visit later tonight.  -No focal deficits to suggest stroke therefore no CT scan of the head is being done  -UA is negative for pyuria  -influenza PCR is negative  -d/c tamiflu  -TSH--0.877, serum B12--406  -there may be a component of SIADH as sOsm<uOsm  Hyponatremia  - slowly improving and stabilized. Discontinue IVF today, allow regular diet.  - serial BMPs  - hold Celexa  Acute on Chronic Renal Failure (CKD2-3)  - improving with IVF  Iron deficiency anemia  - iron saturation 18%  - start ferrous sulfate  Dementia  - continue risperidone  - minimize hypnotics  Diet: heart Fluids: NS DVT Prophylaxis: Lovenox  Code Status: DNR Family Communication: none  Disposition Plan: ALF  Consultants:  none  Procedures:  none   Antibiotics  Anti-infectives   Start     Dose/Rate Route Frequency Ordered Stop   02/15/13 2200  oseltamivir (TAMIFLU) capsule 75 mg  Status:  Discontinued     75 mg Oral 2 times daily 02/15/13 1824 02/15/13 1832   02/15/13 1900  oseltamivir (TAMIFLU) capsule 75 mg  Status:  Discontinued     75 mg Oral Daily 02/15/13 1832 02/16/13 1013     Antibiotics Given (last 72 hours)   Date/Time Action Medication Dose   02/15/13 2240 Given   oseltamivir (TAMIFLU) capsule 75 mg 75 mg      HPI/Subjective: - no complaints, alert to person only.   Objective: Filed Vitals:   02/17/13 2010 02/18/13 0418 02/18/13 1020 02/18/13 1358  BP: 112/48 153/52  150/81  Pulse: 80 70  73  Temp: 98.4 F (36.9 C) 98.5 F (36.9 C)  98.1 F (36.7 C)  TempSrc: Oral Oral  Oral  Resp: 16 20  20   Height:      Weight:      SpO2: 97% 97% 93% 98%    Intake/Output Summary (Last 24 hours) at 02/18/13  1533 Last data filed at 02/18/13 1216  Gross per 24 hour  Intake   1680 ml  Output      0 ml  Net   1680 ml   Filed Weights   02/15/13 2000  Weight: 78.4 kg (172 lb 13.5 oz)    Exam:   General:  NAD  Cardiovascular: regular rate and rhythm, without MRG  Respiratory: good air movement, clear to auscultation throughout, no wheezing, ronchi or rales  Abdomen: soft, not tender to palpation, positive bowel sounds  MSK: no peripheral edema  Neuro: non focal  Data Reviewed: Basic Metabolic Panel:  Recent Labs Lab 02/15/13 1635 02/16/13 0400 02/16/13 1550 02/17/13 0500 02/18/13 0425  NA 120* 126* 127* 129* 128*  K 5.0 4.6 4.5 4.9 4.0  CL 85* 94* 94* 97 96  CO2 22 20 21 23 20   GLUCOSE 107* 100* 98 98 103*  BUN 18 14 12 12 11   CREATININE 1.32* 1.19* 1.05 1.03 0.85  CALCIUM 8.6 7.8* 8.1* 8.0* 7.9*   Liver Function Tests:  Recent Labs Lab 02/15/13 1855 02/16/13 0400  AST 16 16  ALT 13 11  ALKPHOS 62 55  BILITOT 0.5 0.6  PROT 7.0 6.0  ALBUMIN 3.7 3.2*   CBC:  Recent Labs Lab 02/15/13 1635 02/15/13 1855 02/16/13 0400 02/18/13 0425  WBC 5.1 4.4 4.9 7.4  NEUTROABS 3.1  --   --   --   HGB 10.2* 9.4* 8.9* 9.5*  HCT 29.2* 27.5* 26.2* 28.6*  MCV 86.1 86.8 87.3 88.5  PLT 261 223 199 226   BNP (last 3 results)  Recent Labs  11/18/12 1600  PROBNP 530.7*   CBG:  Recent Labs Lab 02/15/13 1601  GLUCAP 118*    Recent Results (from the past 240 hour(s))  URINE CULTURE     Status: None   Collection Time    02/15/13  4:24 PM      Result Value Range Status   Specimen Description URINE, CATHETERIZED   Final   Special Requests NONE   Final   Culture  Setup Time     Final   Value: 02/15/2013 22:32     Performed at [facility] Count     Final   Value: NO GROWTH     Performed at [facility]   Culture     Final   Value: NO GROWTH     Performed at [facility]   Report Status 02/16/2013 FINAL   Final  CULTURE,  BLOOD (ROUTINE X 2)     Status: None   Collection Time    02/15/13  4:35 PM      Result Value Range Status   Specimen Description BLOOD LAC   Final   Special Requests BOTTLES DRAWN AEROBIC AND ANAEROBIC   Final   Culture  Setup Time     Final   Value: 02/15/2013 20:37     Performed at [facility]   Culture     Final   Value:        BLOOD CULTURE RECEIVED NO GROWTH TO DATE CULTURE WILL BE HELD FOR 5 DAYS BEFORE ISSUING A FINAL NEGATIVE REPORT     Performed at [facility]   Report Status PENDING   Incomplete  CULTURE, BLOOD (ROUTINE X 2)     Status: None   Collection Time    02/15/13  4:55 PM      Result Value Range Status   Specimen Description BLOOD LEFT WRIST   Final   Special Requests BOTTLES DRAWN AEROBIC AND ANAEROBIC   Final   Culture  Setup Time     Final   Value: 02/15/2013 20:39     Performed at [facility]   Culture     Final   Value:        BLOOD CULTURE RECEIVED NO GROWTH TO DATE CULTURE WILL BE HELD FOR 5 DAYS BEFORE ISSUING A FINAL NEGATIVE REPORT     Performed at [facility]   Report Status PENDING   Incomplete  MRSA PCR SCREENING     Status: None   Collection Time    02/15/13 10:56 PM      Result Value Range Status   MRSA by PCR NEGATIVE  NEGATIVE Final   Comment:            The GeneXpert MRSA Assay (FDA     approved for NASAL specimens     only), is one component of a     comprehensive MRSA colonization     surveillance program. It is not     intended to diagnose MRSA     infection nor to guide or     monitor treatment for     MRSA infections.     Studies: No results found.  Scheduled Meds: .  enoxaparin (LOVENOX) injection  40 mg Subcutaneous Q24H  . ferrous sulfate  325 mg Oral BID WC  . fluticasone  1 puff Inhalation BID  . sodium chloride  3 mL Intravenous Q12H   Continuous Infusions: . sodium chloride 20 mL/hr at 02/18/13 1252    Active Problems:   Hyponatremia   Altered mental status   Time  spent: 25  Pamella Pertostin Marnie Fazzino, MD [facility] Pager [phone]. If 7 PM - 7 AM, please contact night-coverage at [email or web address], password [email or web address] 02/18/2013, 3:33 PM  LOS: 3 days

## 2013-02-19 LAB — BASIC METABOLIC PANEL
BUN: 8 mg/dL (ref 6–23)
CALCIUM: 8.2 mg/dL — AB (ref 8.4–10.5)
CO2: 23 mEq/L (ref 19–32)
Chloride: 95 mEq/L — ABNORMAL LOW (ref 96–112)
Creatinine, Ser: 0.78 mg/dL (ref 0.50–1.10)
GFR calc Af Amer: 85 mL/min — ABNORMAL LOW (ref >90)
GFR calc non Af Amer: 74 mL/min — ABNORMAL LOW (ref >90)
GLUCOSE: 106 mg/dL — AB (ref 70–99)
Potassium: 3.8 mEq/L (ref 3.7–5.3)
Sodium: 129 mEq/L — ABNORMAL LOW (ref 137–147)

## 2013-02-19 MED ORDER — FERROUS SULFATE 325 (65 FE) MG PO TABS: 325.0000 mg | ORAL_TABLET | Freq: Two times a day (BID) | ORAL | Status: DC

## 2013-02-19 NOTE — Progress Notes (Signed)
Physical Therapy Treatment Patient Details Name: Meagan SaleSybil Roland MRN: [id number] DOB: 08-16-1924 Today's Date: 02/19/2013 Time: [id number] PT Time Calculation (min): 25 min  PT Assessment / Plan / Recommendation  History of Present Illness 80 yo female admitted with metabolic encephalopathy, AMS, weakness, FTT. Hx of dementia. Pt is from ALF   PT Comments   Participated fairly well. Pt still will only mobilize minimally-limited by pain, fatigue.   Follow Up Recommendations  Home health PT;Supervision/Assistance - 24 hour (return to ALF as long as facility/personal aide can continue to provide current level of care)     Does the patient have the potential to tolerate intense rehabilitation     Barriers to Discharge        Equipment Recommendations       Recommendations for Other Services    Frequency Min 2X/week   Progress towards PT Goals Progress towards PT goals: Progressing toward goals (slowly)  Plan Current plan remains appropriate    Precautions / Restrictions Precautions Precautions: Fall Restrictions Weight Bearing Restrictions: No   Pertinent Vitals/Pain R knee with ambulation-unrated "this knee really hurts"    Mobility  Bed Mobility Overal bed mobility: Needs Assistance Bed Mobility: Supine to Sit;Sit to Supine Supine to sit: Min assist;HOB elevated Sit to supine: Mod assist General bed mobility comments: Improved initiation this session-required less assistance for bed mobility. Increased time. Multimodal cues for safety, technique Transfers Overall transfer level: Needs assistance Equipment used: Rolling walker (2 wheeled) Transfers: Sit to/from Stand Sit to Stand: Min assist General transfer comment: x3 for hygiene.  Assist to rise, stabillize, control descent. Pt prefers to pull up on walker.  Ambulation/Gait Ambulation/Gait assistance: Min assist Ambulation Distance (Feet): 5 Feet (x 2) Assistive device: Rolling walker (2 wheeled) Gait  Pattern/deviations: Decreased stride length;Decreased step length - left;Decreased step length - right;Antalgic General Gait Details: Assist to stabilize throughout ambulation. slow gait speed. Limited by R knee pain. Pt declined to ambulate any farther.     Exercises     PT Diagnosis:    PT Problem List:   PT Treatment Interventions:     PT Goals (current goals can now be found in the care plan section)    Visit Information  Last PT Received On: 02/19/13 Assistance Needed: +1 History of Present Illness: 80 yo female admitted with metabolic encephalopathy, AMS, weakness, FTT. Hx of dementia. Pt is from ALF    Subjective Data      Cognition  Cognition Arousal/Alertness: Awake/alert Behavior During Therapy: WFL for tasks assessed/performed Overall Cognitive Status: History of cognitive impairments - at baseline    Balance     End of Session PT - End of Session Activity Tolerance: Patient limited by pain;Patient limited by fatigue Patient left: in bed;with call bell/phone within reach;with bed alarm set   GP     Rebeca AlertJannie Sevin Farone, MPT Pager: [phone]

## 2013-02-19 NOTE — Progress Notes (Signed)
Report called to [facility] and talked with med tech- Psychologist, educationalsharon wiltisher, asked for nurse and she states - med techs take report at this facility.

## 2013-02-19 NOTE — Discharge Summary (Signed)
Physician Discharge Summary  Meagan [name] ZOX:[id number] DOB: [birth year] DOA: 02/15/2013  PCP: Talmadge Coventry, MD  Admit date: 02/15/2013 Discharge date: 02/19/2013  Time spent: 35 minutes  Recommendations for Outpatient Follow-up:   Follow up with PCP in 1-2 week  Recommendations for primary care physician for things to follow:  Repeat BMP  Discharge Diagnoses:  Active Problems:   Hyponatremia   Altered mental status  Discharge Condition: stable  Diet recommendation: heart healthy  Filed Weights   02/15/13 2000  Weight: 78.4 kg (172 lb 13.5 oz)   History of present illness:  80 year old female from [facility] comes in with generalized weakness, altered mental status, decreased by mouth intake for the last 24 hours. History is provided by the daughter. The patient had a low-grade fever of 99.3 at the nursing home.  She was recently treated for UTI with Bactrim. She was also started on Tamiflu today for suspected flu but has not taken any yet. Patient's dementia is progressively getting worse, she has been sundowning. She received a wheelchair prescribed by her PCP today. She states that the past month she has had increased number of falls and has fallen 6 times in the past 3 days. There is no history of head injury.She is incontinent of urine at baseline and wears diapers. Patient's daughter is concerned the patient may be dehydrated as "she does not drink or eat well". Patient denies any pain currently. Sodium is 120, rest of the workup was negative.  Hospital Course:  Acute Metabolic Encephalopathy  - due to hyponatremia which is secondary to dehydration, now improved, at baseline per sitter.  No focal deficits to suggest stroke therefore no CT scan of the head is being done. UA is negative for pyuria, urine cultures negative. Blood cultures negative. Influenza PCR is negative  - TSH--0.877, serum B12--406  - there may be a component of SIADH as  sOsm<uOsm  Hyponatremia  - slowly improving and stabilized ~129. I recommend that she follows up with her PCP in 1 week for repeat blood work.  Acute on Chronic Renal Failure (CKD2-3) - improving with IVF  Iron deficiency anemia - iron saturation 18%. Start ferrous sulfate  Dementia - continue risperidone - minimize hypnotics  Procedures:  none   Consultations:  none  Discharge Exam: Filed Vitals:   02/18/13 1956 02/18/13 2005 02/19/13 0515 02/19/13 0919  BP:  133/60 122/55   Pulse:  76 73 77  Temp:  98.4 F (36.9 C) 98.5 F (36.9 C)   TempSrc:  Oral Oral   Resp:  16 20 20   Height:      Weight:      SpO2: 95% 98% 94% 95%   General: NAD Cardiovascular: RRR Respiratory: CTA biL  Discharge Instructions    Medication List    STOP taking these medications       oseltamivir 75 MG capsule  Commonly known as:  TAMIFLU     sulfamethoxazole-trimethoprim 800-160 MG per tablet  Commonly known as:  BACTRIM DS      TAKE these medications       celecoxib 200 MG capsule  Commonly known as:  CELEBREX  Take 200 mg by mouth 2 (two) times daily.     citalopram 20 MG tablet  Commonly known as:  CELEXA  Take 20 mg by mouth daily.     diclofenac sodium 1 % Gel  Commonly known as:  VOLTAREN  Apply 4 g topically every 8 (eight) hours. Bilateral knees.  ferrous sulfate 325 (65 FE) MG tablet  Take 1 tablet (325 mg total) by mouth 2 (two) times daily with a meal.     fluticasone 220 MCG/ACT inhaler  Commonly known as:  FLOVENT HFA  Inhale 1 puff into the lungs 2 (two) times daily.     HYDROcodone-acetaminophen 5-325 MG per tablet  Commonly known as:  NORCO/VICODIN  Take 1 tablet by mouth every 6 (six) hours. Severe bilateral knee pain.     LORazepam 0.5 MG tablet  Commonly known as:  ATIVAN  Take 0.25 mg by mouth every 4 (four) hours as needed for anxiety. Do not exceed 1.5 mg /24h     ondansetron 4 MG tablet  Commonly known as:  ZOFRAN  Take 4 mg by mouth every 6  (six) hours as needed for nausea or vomiting.     Q-NOL 325 MG tablet  Generic drug:  acetaminophen  Take 325 mg by mouth 2 (two) times daily.     risperiDONE 0.25 MG tablet  Commonly known as:  RISPERDAL  Take 0.25 mg by mouth daily.         The results of significant diagnostics from this hospitalization (including imaging, microbiology, ancillary and laboratory) are listed below for reference.    Significant Diagnostic Studies: Dg Chest 2 View  02/15/2013   CLINICAL DATA:  Cough, COPD, possible infiltrate  EXAM: CHEST  2 VIEW  COMPARISON:  11/18/2012  FINDINGS: Cardiomediastinal silhouette is stable. Atherosclerotic calcifications of thoracic aorta again noted. No acute infiltrate or pleural effusion no pulmonary edema. Stable compression deformity lower thoracic spine.  IMPRESSION: No active cardiopulmonary disease.   Electronically Signed   By: Natasha MeadLiviu  Pop M.D.   On: 02/15/2013 17:08    Microbiology: Recent Results (from the past 240 hour(s))  URINE CULTURE     Status: None   Collection Time    02/15/13  4:24 PM      Result Value Range Status   Specimen Description URINE, CATHETERIZED   Final   Special Requests NONE   Final   Culture  Setup Time     Final   Value: 02/15/2013 22:32     Performed at [facility]   Colony Count     Final   Value: NO GROWTH     Performed at [facility]   Culture     Final   Value: NO GROWTH     Performed at [facility]   Report Status 02/16/2013 FINAL   Final  CULTURE, BLOOD (ROUTINE X 2)     Status: None   Collection Time    02/15/13  4:35 PM      Result Value Range Status   Specimen Description BLOOD LAC   Final   Special Requests BOTTLES DRAWN AEROBIC AND ANAEROBIC 5ML   Final   Culture  Setup Time     Final   Value: 02/15/2013 20:37     Performed at [facility]   Culture     Final   Value:        BLOOD CULTURE RECEIVED NO GROWTH TO DATE CULTURE WILL BE HELD FOR 5 DAYS BEFORE ISSUING A FINAL  NEGATIVE REPORT     Performed at [facility]   Report Status PENDING   Incomplete  CULTURE, BLOOD (ROUTINE X 2)     Status: None   Collection Time    02/15/13  4:55 PM      Result Value Range Status  Specimen Description BLOOD LEFT WRIST   Final   Special Requests BOTTLES DRAWN AEROBIC AND ANAEROBIC   Final   Culture  Setup Time     Final   Value: 02/15/2013 20:39     Performed at [facility]   Culture     Final   Value:        BLOOD CULTURE RECEIVED NO GROWTH TO DATE CULTURE WILL BE HELD FOR 5 DAYS BEFORE ISSUING A FINAL NEGATIVE REPORT     Performed at [facility]   Report Status PENDING   Incomplete  MRSA PCR SCREENING     Status: None   Collection Time    02/15/13 10:56 PM      Result Value Range Status   MRSA by PCR NEGATIVE  NEGATIVE Final   Comment:            The GeneXpert MRSA Assay (FDA     approved for NASAL specimens     only), is one component of a     comprehensive MRSA colonization     surveillance program. It is not     intended to diagnose MRSA     infection nor to guide or     monitor treatment for     MRSA infections.     Labs: Basic Metabolic Panel:  Recent Labs Lab 02/16/13 0400 02/16/13 1550 02/17/13 0500 02/18/13 0425 02/19/13 0445  NA 126* 127* 129* 128* 129*  K 4.6 4.5 4.9 4.0 3.8  CL 94* 94* 97 96 95*  CO2 20 21 23 20 23   GLUCOSE 100* 98 98 103* 106*  BUN 14 12 12 11 8   CREATININE 1.19* 1.05 1.03 0.85 0.78  CALCIUM 7.8* 8.1* 8.0* 7.9* 8.2*   Liver Function Tests:  Recent Labs Lab 02/15/13 1855 02/16/13 0400  AST 16 16  ALT 13 11  ALKPHOS 62 55  BILITOT 0.5 0.6  PROT 7.0 6.0  ALBUMIN 3.7 3.2*   No results found for this basename: LIPASE, AMYLASE,  in the last 168 hours No results found for this basename: AMMONIA,  in the last 168 hours CBC:  Recent Labs Lab 02/15/13 1635 02/15/13 1855 02/16/13 0400 02/18/13 0425  WBC 5.1 4.4 4.9 7.4  NEUTROABS 3.1  --   --   --   HGB 10.2* 9.4* 8.9*  9.5*  HCT 29.2* 27.5* 26.2* 28.6*  MCV 86.1 86.8 87.3 88.5  PLT 261 223 199 226   Cardiac Enzymes: No results found for this basename: CKTOTAL, CKMB, CKMBINDEX, TROPONINI,  in the last 168 hours BNP: BNP (last 3 results)  Recent Labs  11/18/12 1600  PROBNP 530.7*   CBG:  Recent Labs Lab 02/15/13 1601  GLUCAP 118*       Signed:  Jsiah Menta  [facility] 02/19/2013, 10:01 AM

## 2013-02-21 LAB — CULTURE, BLOOD (ROUTINE X 2)
CULTURE: NO GROWTH
Culture: NO GROWTH

## 2013-06-16 ENCOUNTER — Emergency Department (HOSPITAL_BASED_OUTPATIENT_CLINIC_OR_DEPARTMENT_OTHER)
Admission: EM | Admit: 2013-06-16 | Discharge: 2013-06-16 | Disposition: A | Discharge status: Home / Self Care | Payer: Medicare Other | Attending: Emergency Medicine | Admitting: Emergency Medicine

## 2013-06-16 ENCOUNTER — Emergency Department (HOSPITAL_BASED_OUTPATIENT_CLINIC_OR_DEPARTMENT_OTHER): Payer: Medicare Other

## 2013-06-16 ENCOUNTER — Encounter (HOSPITAL_BASED_OUTPATIENT_CLINIC_OR_DEPARTMENT_OTHER): Payer: Self-pay | Admitting: Emergency Medicine

## 2013-06-16 DIAGNOSIS — J449 Chronic obstructive pulmonary disease, unspecified: Secondary | ICD-10-CM | POA: Insufficient documentation

## 2013-06-16 DIAGNOSIS — F3289 Other specified depressive episodes: Secondary | ICD-10-CM | POA: Insufficient documentation

## 2013-06-16 DIAGNOSIS — Z79899 Other long term (current) drug therapy: Secondary | ICD-10-CM | POA: Insufficient documentation

## 2013-06-16 DIAGNOSIS — T07XXXA Unspecified multiple injuries, initial encounter: Secondary | ICD-10-CM | POA: Insufficient documentation

## 2013-06-16 DIAGNOSIS — IMO0002 Reserved for concepts with insufficient information to code with codable children: Secondary | ICD-10-CM | POA: Insufficient documentation

## 2013-06-16 DIAGNOSIS — Z87891 Personal history of nicotine dependence: Secondary | ICD-10-CM | POA: Insufficient documentation

## 2013-06-16 DIAGNOSIS — W010XXA Fall on same level from slipping, tripping and stumbling without subsequent striking against object, initial encounter: Secondary | ICD-10-CM | POA: Insufficient documentation

## 2013-06-16 DIAGNOSIS — Z862 Personal history of diseases of the blood and blood-forming organs and certain disorders involving the immune mechanism: Secondary | ICD-10-CM | POA: Insufficient documentation

## 2013-06-16 DIAGNOSIS — T148XXA Other injury of unspecified body region, initial encounter: Secondary | ICD-10-CM

## 2013-06-16 DIAGNOSIS — Y9389 Activity, other specified: Secondary | ICD-10-CM | POA: Insufficient documentation

## 2013-06-16 DIAGNOSIS — F0280 Dementia in other diseases classified elsewhere without behavioral disturbance: Secondary | ICD-10-CM | POA: Insufficient documentation

## 2013-06-16 DIAGNOSIS — J4489 Other specified chronic obstructive pulmonary disease: Secondary | ICD-10-CM | POA: Insufficient documentation

## 2013-06-16 DIAGNOSIS — Z791 Long term (current) use of non-steroidal anti-inflammatories (NSAID): Secondary | ICD-10-CM | POA: Insufficient documentation

## 2013-06-16 DIAGNOSIS — G309 Alzheimer's disease, unspecified: Secondary | ICD-10-CM | POA: Insufficient documentation

## 2013-06-16 DIAGNOSIS — S99919A Unspecified injury of unspecified ankle, initial encounter: Secondary | ICD-10-CM

## 2013-06-16 DIAGNOSIS — S99929A Unspecified injury of unspecified foot, initial encounter: Secondary | ICD-10-CM

## 2013-06-16 DIAGNOSIS — W19XXXA Unspecified fall, initial encounter: Secondary | ICD-10-CM

## 2013-06-16 DIAGNOSIS — F411 Generalized anxiety disorder: Secondary | ICD-10-CM | POA: Insufficient documentation

## 2013-06-16 DIAGNOSIS — S4980XA Other specified injuries of shoulder and upper arm, unspecified arm, initial encounter: Secondary | ICD-10-CM | POA: Insufficient documentation

## 2013-06-16 DIAGNOSIS — Y9289 Other specified places as the place of occurrence of the external cause: Secondary | ICD-10-CM | POA: Insufficient documentation

## 2013-06-16 DIAGNOSIS — M1710 Unilateral primary osteoarthritis, unspecified knee: Secondary | ICD-10-CM | POA: Insufficient documentation

## 2013-06-16 DIAGNOSIS — S0990XA Unspecified injury of head, initial encounter: Secondary | ICD-10-CM | POA: Insufficient documentation

## 2013-06-16 DIAGNOSIS — S8990XA Unspecified injury of unspecified lower leg, initial encounter: Secondary | ICD-10-CM | POA: Insufficient documentation

## 2013-06-16 DIAGNOSIS — Z880 Allergy status to penicillin: Secondary | ICD-10-CM | POA: Insufficient documentation

## 2013-06-16 DIAGNOSIS — Z8639 Personal history of other endocrine, nutritional and metabolic disease: Secondary | ICD-10-CM | POA: Insufficient documentation

## 2013-06-16 DIAGNOSIS — S46909A Unspecified injury of unspecified muscle, fascia and tendon at shoulder and upper arm level, unspecified arm, initial encounter: Secondary | ICD-10-CM | POA: Insufficient documentation

## 2013-06-16 DIAGNOSIS — F329 Major depressive disorder, single episode, unspecified: Secondary | ICD-10-CM | POA: Insufficient documentation

## 2013-06-16 MED ORDER — ACETAMINOPHEN 325 MG PO TABS
650.0000 mg | ORAL_TABLET | Freq: Once | ORAL | Status: AC
  Administered 2013-06-16: 650 mg via ORAL
  Filled 2013-06-16: qty 2

## 2013-06-16 NOTE — ED Notes (Signed)
Pt calling out, demanding that I call her husband. No husband listed on pt chart from [facility], when asked pt her husband's name, she states "I don't remember." when asked what his telephone number, pt states "I don't know that either!" pt states "I want him to come get me so I can go home with him!" informed pt that she came here by ems from [place], pt states 'I don't live there! I live with my husband and he is taking me shopping tonight!"

## 2013-06-16 NOTE — ED Notes (Signed)
Patient transported to X-ray 

## 2013-06-16 NOTE — ED Provider Notes (Addendum)
CSN: [id number]     Arrival date & time 06/16/13  1609 History   First MD Initiated Contact with Patient 06/16/13 1614     Chief Complaint  Patient presents with  . Fall  . Head Injury     (Consider location/radiation/quality/duration/timing/severity/associated sxs/prior Treatment) Patient is a 80 y.o. female presenting with fall and head injury. The history is provided by the patient, the nursing home and the EMS personnel.  Fall This is a new problem. The current episode started less than 1 hour ago. The problem occurs constantly. The problem has not changed since onset.Associated symptoms comments: Per Nursing home staff pt tripped and fell hitting her head on the carpeted floor without LOC.  NO anticoagulation.  Pt c/o of headache, and right knee pain.. The symptoms are aggravated by bending. Nothing relieves the symptoms. She has tried nothing for the symptoms. The treatment provided no relief.  Head Injury   Past Medical History  Diagnosis Date  . Dementia   . Anxiety   . Alzheimer disease   . COPD (chronic obstructive pulmonary disease)   . Hypothyroidism     "on pills years ago; stopped taking them in the 1960's" (11/18/2012)  . History of blood transfusion 1965    "w/hysterectomy" (11/18/2012)  . Headache(784.0)     "semi frequently; sometimes sinus; sometimes generalized" (11/18/2012)  . Osteoarthritis of both knees   . Depression    Past Surgical History  Procedure Laterality Date  . Tonsillectomy      "twice" (11/18/2012)  . Vaginal hysterectomy  1965  . Dilation and curettage of uterus      "several before hysterectomy" (11/18/2012)  . Cesarean section  1954; 1958  . Nasal sinus surgery Bilateral ~2002  . Cataract extraction w/ intraocular lens  implant, bilateral Bilateral 1990's   History reviewed. No pertinent family history. History  Substance Use Topics  . Smoking status: Former Smoker -- 1.50 packs/day for 20 years    Types: Cigarettes    Quit date:  08/21/1964  . Smokeless tobacco: Never Used  . Alcohol Use: Yes     Comment: 11/18/2012 "hasn't had any alcoholic beverage in 4 yrs; prior to that she might have a glass of wine ~ q month"   OB History   Grav Para Term Preterm Abortions TAB SAB Ect Mult Living                 Review of Systems  All other systems reviewed and are negative.     Allergies  Morphine and related and Penicillins  Home Medications   Prior to Admission medications   Medication Sig Start Date End Date Taking? Authorizing Provider  acetaminophen (Q-NOL) 325 MG tablet Take 325 mg by mouth 2 (two) times daily.    Historical Provider, MD  celecoxib (CELEBREX) 200 MG capsule Take 200 mg by mouth 2 (two) times daily.    Historical Provider, MD  citalopram (CELEXA) 20 MG tablet Take 20 mg by mouth daily.    Historical Provider, MD  diclofenac sodium (VOLTAREN) 1 % GEL Apply 4 g topically every 8 (eight) hours. Bilateral knees.    Historical Provider, MD  ferrous sulfate 325 (65 FE) MG tablet Take 1 tablet (325 mg total) by mouth 2 (two) times daily with a meal. 02/19/13   Costin Otelia SergeantM Gherghe, MD  fluticasone (FLOVENT HFA) 220 MCG/ACT inhaler Inhale 1 puff into the lungs 2 (two) times daily. 11/19/12   Nishant Dhungel, MD  HYDROcodone-acetaminophen (NORCO/VICODIN) 5-325 MG per  tablet Take 1 tablet by mouth every 6 (six) hours. Severe bilateral knee pain.    Historical Provider, MD  LORazepam (ATIVAN) 0.5 MG tablet Take 0.25 mg by mouth every 4 (four) hours as needed for anxiety. Do not exceed 1.5 mg /24h    Historical Provider, MD  ondansetron (ZOFRAN) 4 MG tablet Take 4 mg by mouth every 6 (six) hours as needed for nausea or vomiting.    Historical Provider, MD  risperiDONE (RISPERDAL) 0.25 MG tablet Take 0.25 mg by mouth daily.    Historical Provider, MD   BP 159/88  Pulse 69  Temp(Src) 98.1 F (36.7 C) (Oral)  Resp 18  Ht 5\' 1"  (1.549 m)  Wt 172 lb (78.019 kg)  BMI 32.52 kg/m2  SpO2 97% Physical Exam    Nursing note and vitals reviewed. Constitutional: She appears well-developed and well-nourished. No distress.  HENT:  Head: Normocephalic and atraumatic.  Mouth/Throat: Oropharynx is clear and moist.  Eyes: Conjunctivae and EOM are normal. Pupils are equal, round, and reactive to light.  Neck: Normal range of motion. Neck supple. No spinous process tenderness and no muscular tenderness present.  Cardiovascular: Normal rate, regular rhythm and intact distal pulses.   No murmur heard. Pulmonary/Chest: Effort normal and breath sounds normal. No respiratory distress. She has no wheezes. She has no rales.  Abdominal: Soft. She exhibits no distension. There is no tenderness. There is no rebound and no guarding.  Musculoskeletal: She exhibits tenderness. She exhibits no edema.       Right knee: She exhibits decreased range of motion. She exhibits no swelling, no effusion, no ecchymosis and no deformity. Tenderness found. Medial joint line and lateral joint line tenderness noted.       Arms: Neurological: She is alert.  Oriented to place and time  Skin: Skin is warm and dry. No rash noted. No erythema.  Psychiatric: She has a normal mood and affect. Her behavior is normal.    ED Course  Procedures (including critical care time) Labs Review Labs Reviewed - No data to display  Imaging Review Ct Head Wo Contrast  06/16/2013   CLINICAL DATA:  fall, pain fall, pain  EXAM: CT HEAD WITHOUT CONTRAST  TECHNIQUE: Contiguous axial images were obtained from the base of the skull through the vertex without intravenous contrast.  COMPARISON:  None.  FINDINGS: Patchy opacification of ethmoid air cells. Mucoperiosteal thickening in the right frontal sinus. Atherosclerotic and physiologic intracranial calcifications. Moderate diffuse parenchymal atrophy. There is no evidence of acute intracranial hemorrhage, brain edema, mass lesion, acute infarction, mass effect, or midline shift. Acute infarct may be  inapparent on noncontrast CT. No other intra-axial abnormalities are seen, and the ventricles and sulci are within normal limits in size and symmetry. No abnormal extra-axial fluid collections or masses are identified. No significant calvarial abnormality.  IMPRESSION: 1. Diffuse atrophy, without bleed or other acute intracranial process.   Electronically Signed   By: Oley Balm M.D.   On: 06/16/2013 17:20   Dg Knee Complete 4 Views Right  06/16/2013   CLINICAL DATA:  Right knee pain, fall.  EXAM: RIGHT KNEE - COMPLETE 4+ VIEW  COMPARISON:  None.  FINDINGS: Advanced degenerative joint disease changes within the right knee, most pronounced in the medial and patellofemoral compartments. Near complete joint space loss in the medial compartment. No acute bony abnormality. Specifically, no fracture, subluxation, or dislocation. Soft tissues are intact. No joint effusion.  IMPRESSION: Advanced tricompartment degenerative changes, most pronounced in the medial  compartment. No acute bony abnormality.   Electronically Signed   By: Charlett Nose M.D.   On: 06/16/2013 17:26     EKG Interpretation None      MDM   Final diagnoses:  Fall  Contusion    Patient with a mechanical fall today when she tripped and hit her head. This was witnessed by staff and they deny LOC. Patient does not take anticoagulations at this time. On exam patient is complaining of a headache but denies neck pain. Also complaining of right knee pain on exam. Otherwise the rest of her exam is benign.  Head CT and right knee imaging pending  5:34 PM Imaging neg and pt d/ced home.  Gwyneth Sprout, MD 06/16/13 1734  Gwyneth Sprout, MD 06/16/13 6222

## 2013-06-16 NOTE — Discharge Instructions (Signed)
Contusion A contusion is a deep bruise. Contusions happen when an injury causes bleeding under the skin. Signs of bruising include pain, puffiness (swelling), and discolored skin. The contusion may turn blue, purple, or yellow. HOME CARE   Put ice on the injured area.  Put ice in a plastic bag.  Place a towel between your skin and the bag.  Leave the ice on for 15-20 minutes, 03-04 times a day.  Only take medicine as told by your doctor.  Rest the injured area.  If possible, raise (elevate) the injured area to lessen puffiness. GET HELP RIGHT AWAY IF:   You have more bruising or puffiness.  You have pain that is getting worse.  Your puffiness or pain is not helped by medicine. MAKE SURE YOU:   Understand these instructions.  Will watch your condition.  Will get help right away if you are not doing well or get worse. Document Released: 06/26/2007 Document Revised: 04/01/2011 Document Reviewed: 11/12/2010 [facility] Patient Information 2014 [place], [place].  Fall Prevention and Home Safety Falls cause injuries and can affect all age groups. It is possible to use preventive measures to significantly decrease the likelihood of falls. There are many simple measures which can make your home safer and prevent falls. OUTDOORS  Repair cracks and edges of walkways and driveways.  Remove high doorway thresholds.  Trim shrubbery on the main path into your home.  Have good outside lighting.  Clear walkways of tools, rocks, debris, and clutter.  Check that handrails are not broken and are securely fastened. Both sides of steps should have handrails.  Have leaves, snow, and ice cleared regularly.  Use sand or salt on walkways during winter months.  In the garage, clean up grease or oil spills. BATHROOM  Install night lights.  Install grab bars by the toilet and in the tub and shower.  Use non-skid mats or decals in the tub or shower.  Place a plastic non-slip stool in  the shower to sit on, if needed.  Keep floors dry and clean up all water on the floor immediately.  Remove soap buildup in the tub or shower on a regular basis.  Secure bath mats with non-slip, double-sided rug tape.  Remove throw rugs and tripping hazards from the floors. BEDROOMS  Install night lights.  Make sure a bedside light is easy to reach.  Do not use oversized bedding.  Keep a telephone by your bedside.  Have a firm chair with side arms to use for getting dressed.  Remove throw rugs and tripping hazards from the floor. KITCHEN  Keep handles on pots and pans turned toward the center of the stove. Use back burners when possible.  Clean up spills quickly and allow time for drying.  Avoid walking on wet floors.  Avoid hot utensils and knives.  Position shelves so they are not too high or low.  Place commonly used objects within easy reach.  If necessary, use a sturdy step stool with a grab bar when reaching.  Keep electrical cables out of the way.  Do not use floor polish or wax that makes floors slippery. If you must use wax, use non-skid floor wax.  Remove throw rugs and tripping hazards from the floor. STAIRWAYS  Never leave objects on stairs.  Place handrails on both sides of stairways and use them. Fix any loose handrails. Make sure handrails on both sides of the stairways are as long as the stairs.  Check carpeting to make sure it is  firmly attached along stairs. Make repairs to worn or loose carpet promptly.  Avoid placing throw rugs at the top or bottom of stairways, or properly secure the rug with carpet tape to prevent slippage. Get rid of throw rugs, if possible.  Have an electrician put in a light switch at the top and bottom of the stairs. OTHER FALL PREVENTION TIPS  Wear low-heel or rubber-soled shoes that are supportive and fit well. Wear closed toe shoes.  When using a stepladder, make sure it is fully opened and both spreaders are  firmly locked. Do not climb a closed stepladder.  Add color or contrast paint or tape to grab bars and handrails in your home. Place contrasting color strips on first and last steps.  Learn and use mobility aids as needed. Install an electrical emergency response system.  Turn on lights to avoid dark areas. Replace light bulbs that burn out immediately. Get light switches that glow.  Arrange furniture to create clear pathways. Keep furniture in the same place.  Firmly attach carpet with non-skid or double-sided tape.  Eliminate uneven floor surfaces.  Select a carpet pattern that does not visually hide the edge of steps.  Be aware of all pets. OTHER HOME SAFETY TIPS  Set the water temperature for 120 F (48.8 C).  Keep emergency numbers on or near the telephone.  Keep smoke detectors on every level of the home and near sleeping areas. Document Released: 12/28/2001 Document Revised: 07/09/2011 Document Reviewed: 03/29/2011 [facility] Patient Information 2014 [place], [place].

## 2013-06-16 NOTE — ED Notes (Signed)
[facility] called for pt transport back to [facility].

## 2013-06-16 NOTE — ED Notes (Signed)
Pt to room 8 by [facility] via stretcher, per [facility] staff pt had trip and fall hitting head and left elbow and left knee onto carpeted floor. Per staff, no loc. Pt is a/a/ox4.

## 2013-06-28 ENCOUNTER — Ambulatory Visit: Payer: Self-pay | Admitting: Podiatry

## 2013-07-05 ENCOUNTER — Ambulatory Visit: Payer: Self-pay | Admitting: Podiatry

## 2014-11-01 ENCOUNTER — Other Ambulatory Visit: Payer: Self-pay | Admitting: *Deleted

## 2014-11-01 ENCOUNTER — Non-Acute Institutional Stay (SKILLED_NURSING_FACILITY): Payer: Medicare Other | Admitting: Internal Medicine

## 2014-11-01 DIAGNOSIS — F418 Other specified anxiety disorders: Secondary | ICD-10-CM

## 2014-11-01 DIAGNOSIS — G308 Other Alzheimer's disease: Secondary | ICD-10-CM

## 2014-11-01 DIAGNOSIS — F0281 Dementia in other diseases classified elsewhere with behavioral disturbance: Secondary | ICD-10-CM

## 2014-11-01 DIAGNOSIS — M179 Osteoarthritis of knee, unspecified: Secondary | ICD-10-CM | POA: Diagnosis not present

## 2014-11-01 DIAGNOSIS — G309 Alzheimer's disease, unspecified: Principal | ICD-10-CM

## 2014-11-01 DIAGNOSIS — M1711 Unilateral primary osteoarthritis, right knee: Secondary | ICD-10-CM

## 2014-11-01 DIAGNOSIS — E871 Hypo-osmolality and hyponatremia: Secondary | ICD-10-CM

## 2014-11-01 MED ORDER — LORAZEPAM 0.5 MG PO TABS: ORAL_TABLET | ORAL | Status: AC

## 2014-11-01 NOTE — Telephone Encounter (Signed)
[facility] 

## 2014-11-01 NOTE — Progress Notes (Signed)
Patient ID: Meagan [name], female   DOB: 1924-08-05, 81 y.o.   MRN: [id number]   Facility; [facility] Chief complaint; admission to SNF post transfer from [facility] assisted living in [facility] History; this is a patient to had Alzheimer's disease diagnosed in 05-25-09 per her daughter who is with her today. She probably had symptoms for 2-3 years before that. This is now at its advanced stages. The patient is no longer ambulatory, doubly incontinent, no longer able to use utensils. She is transferred here largely because of financial reasons/Medicaid. She is strictly no code and comfort care measures in the facility. She had a [address] most form and a [place] DO NOT RESUSCITATE form but those don't seem to have come with her  Other medical issues/ PMHX; include chronic hyponatremia, behavioral disturbances related to her dementia, history of depression, severe osteoarthritis especially of the right knee, chronic edema. The last note in the [place] care system shows her to have admitted to hospital in May 25, 2013 with a sodium of 120 she left with a sodium of 129 and apparently had never recovered according to her daughter. It was checked in the last year and a half at 126 although I don't have those records  Past Medical History  Diagnosis Date  . Dementia   . Anxiety   . Alzheimer disease   . COPD (chronic obstructive pulmonary disease)   . Hypothyroidism     "on pills years ago; stopped taking them in 1960's" (11/18/2012)  . History of blood transfusion 1965    "w/hysterectomy" (11/18/2012)  . Headache(784.0)     "semi frequently; sometimes sinus; sometimes generalized" (11/18/2012)  . Osteoarthritis of both knees   . Depression     Past Surgical History  Procedure Laterality Date  . Tonsillectomy      "twice" (11/18/2012)  . Vaginal hysterectomy  1965  . Dilation and curettage of uterus      "several before hysterectomy" (11/18/2012)  . Cesarean section   1954; 1958  . Nasal sinus surgery Bilateral ~2002  . Cataract extraction w/ intraocular lens  implant, bilateral Bilateral 1990's    Current medications Citalopram 20 daily Lorazepam 0.5 every morning Meloxicam 7.5 daily Tylenol 325 twice a day Risperdal 0.25 twice a day Hydrocodone/APAP 5/325 one tablet 4 times daily  Social; the patient is a DO NOT RESUSCITATE and do not hospitalize. There is a [place] most form that apparently didn't come with her from the other facility although I don't see this. No history of alcohol or smoking. She is wheelchair-bound two-person transfer. Double incontinence  Family history; patient has 2 children one lives in [place] one in [place]. She is a widow husband died in 05/25/12  Review of systems is not possible from the patient however her daughter reports that she is eating poorly only able to eat finger foods  Physical examination Gen. the patient does not look unwell. HEENT eyes appear normal. Moist oral mucosa Neck; no thyroid no lymphadenopathy Lymph none in the cervical clavicular or axilla region in Breasts no masses are noted. Respiratory no clubbing clear entry bilaterally Cardiac; heart sounds are normal she appears to be euvolemic no murmurs Abdomen; soft no liver no spleen no ascites GU bladder is not distended there is no CVA tenderness Extremities what appears to be severe osteoarthritis of the right greater than left knee. She has some degree of venous stasis and lymphedema. Peripheral pulses are palpable. Neurologic; she appears to be able to move  her limbs Mental status; the patient is still verbal I did not test her cognition in front of her daughter.  Impression/plan #1 apparently severe Alzheimer's disease with psychosis she is on a small dose of Risperdal. I will not touch this for now #2 osteoarthritis of her right greater than left knee with chronic pain. This is the reason apparently for the routine hydrocodone and  the Mobic #3 hyponatremia; she appears to be euvolemic. Her daughter doesn't seem interested in having this repeated. She wants comfort care and the facility #4 history of depression with anxiety this was not apparent today. #5 according to her daughter a history of both recurrent UTIs and vaginal candidiasis although neither one of these is been particularly problematic lately #6 I'm having the staff work to get the [place] most form redone and her DO NOT RESUSCITATE paperwork done in the facility while I am here. I'll also write no hospitalization orders.

## 2014-11-29 ENCOUNTER — Non-Acute Institutional Stay (SKILLED_NURSING_FACILITY): Payer: Medicare Other | Admitting: Internal Medicine

## 2014-11-29 DIAGNOSIS — G308 Other Alzheimer's disease: Secondary | ICD-10-CM | POA: Diagnosis not present

## 2014-11-29 DIAGNOSIS — E871 Hypo-osmolality and hyponatremia: Secondary | ICD-10-CM | POA: Diagnosis not present

## 2014-11-29 DIAGNOSIS — F0281 Dementia in other diseases classified elsewhere with behavioral disturbance: Secondary | ICD-10-CM

## 2014-11-29 DIAGNOSIS — G309 Alzheimer's disease, unspecified: Principal | ICD-10-CM

## 2014-11-29 DIAGNOSIS — M179 Osteoarthritis of knee, unspecified: Secondary | ICD-10-CM

## 2014-11-29 DIAGNOSIS — M1711 Unilateral primary osteoarthritis, right knee: Secondary | ICD-10-CM

## 2014-12-05 NOTE — Progress Notes (Addendum)
Patient ID: Meagan SaleSybil [name], female   DOB: October 01, 1924, 81 y.o.   MRN: [id number]                PROGRESS NOTE  DATE:  11/29/2014     [facility]                      LEVEL OF CARE:   SNF   Routine Visit   CHIEF COMPLAINT:  Routine visit to follow medical issues.      HISTORY OF PRESENT ILLNESS:  This is a patient who came to us from [facility] assisted [facility] [place].  I admitted her here roughly a month ago.    She has Alzheimer's disease which is at its severe stage.      She is No Code and comfort care.  When I admitted her here, we took care of the paperwork including [facility].    She also has a history of:    Chronic hyponatremia.    Behavioral issues related to her dementia.    History of depression.    Severe osteoarthritis.    Chronic edema.     Her sodium has been low, as low as 120 in 2015.   The exact cause of this has never been completely clear.  When I admitted her, her daughter did not seem interested in having this repeated.    PAST MEDICAL HISTORY/PROBLEM LIST:   Reviewed.      CURRENT MEDICATIONS:  Medication list is reviewed.          Mobic 7.5 q.d.     Ativan 0.5 daily.     Celexa 20 q.d.     Tylenol 325 b.i.d.      Risperdal 0.25 b.i.d.      Norco 5/325, 1 tablet four times a day for osteoarthritic pain.    REVIEW OF SYSTEMS:   Not possible from the patient secondary to dementia.    PHYSICAL EXAMINATION:   VITAL SIGNS:     TEMPERATURE:  97.6.    PULSE:  62.   RESPIRATIONS:  20.     BLOOD PRESSURE:  127/56.   WEIGHT:  Weight has been stable at 167 pounds.    GENERAL APPEARANCE:  The patient is not in any distress.    HEENT:   MOUTH/THROAT:  Moist mucous membranes.      CHEST/RESPIRATORY:  Clear air entry bilaterally.    CARDIOVASCULAR:   CARDIAC:  Heart sounds are normal.  There are no murmurs.    GASTROINTESTINAL:   ABDOMEN:  Nontender.  No masses are noted.     GENITOURINARY:   BLADDER:   Not distended.  There is no CVA tenderness.     ASSESSMENT/PLAN:                Severe Alzheimer's disease.  The patient is essentially comfort care in the facility.    Hyponatremia.  I have not repeated this in view of her comfort care status.  She certainly does not appear to be dehydrated or fluid overloaded.  This could be SIADH.  Generalized osteoarthritis.  Her current pain management seems adequate.

## 2015-01-05 ENCOUNTER — Other Ambulatory Visit: Payer: Self-pay | Admitting: *Deleted

## 2015-01-05 MED ORDER — HYDROCODONE-ACETAMINOPHEN 5-325 MG PO TABS: ORAL_TABLET | ORAL | Status: DC

## 2015-01-05 NOTE — Telephone Encounter (Signed)
[facility] 

## 2015-01-17 ENCOUNTER — Non-Acute Institutional Stay (SKILLED_NURSING_FACILITY): Payer: Medicare Other | Admitting: Internal Medicine

## 2015-01-17 DIAGNOSIS — E871 Hypo-osmolality and hyponatremia: Secondary | ICD-10-CM

## 2015-01-17 DIAGNOSIS — F0281 Dementia in other diseases classified elsewhere with behavioral disturbance: Secondary | ICD-10-CM

## 2015-01-17 DIAGNOSIS — M1711 Unilateral primary osteoarthritis, right knee: Secondary | ICD-10-CM

## 2015-01-17 DIAGNOSIS — G309 Alzheimer's disease, unspecified: Principal | ICD-10-CM

## 2015-01-17 DIAGNOSIS — M179 Osteoarthritis of knee, unspecified: Secondary | ICD-10-CM

## 2015-01-17 DIAGNOSIS — G308 Other Alzheimer's disease: Secondary | ICD-10-CM

## 2015-01-18 NOTE — Progress Notes (Signed)
Patient ID: Meagan [name], female   DOB: [birth year], 81 y.o.   MRN: [id number]                PROGRESS NOTE  DATE:  01/17/2015         FACILITY: [facility]                  LEVEL OF CARE:   SNF   Routine Visit                    CHIEF COMPLAINT:  Review of medical issues.       HISTORY OF PRESENT ILLNESS:  This is a patient who came to [place] from [facility] assisted [facility] in [place].  She came here in October.    She has severe Alzheimer's disease.    She is No Code and comfort care.   Her chart has a DNR, also a [address] MOST form for comfort care.  I went over this with her daughter when she came into the building.    Her major issues are chronic hyponatremia, which is probably SIADH, although I have not even repeated the lab work here as her care status is, as mentioned, comfort care.    She also has behavioral issues related to her dementia, and a history of depression.    PAST MEDICAL HISTORY/PROBLEM LIST:   Reviewed.      PAST SURGICAL HISTORY:   Reviewed.      CURRENT MEDICATIONS:  Medication list is reviewed.         Celexa 20 q.d.     Lorazepam 0.5. at 9 a.m.     Mobic 78.5 q.d.     Tylenol 325 p.o. b.i.d.      Risperdal 0.25 q.a.m. and q.p.m.    Norco 5/325, 1 p.o. q.i.d.      REVIEW OF SYSTEMS:   Not really possible from the patient secondary to dementia, although she is able to tell me that she has complaints about pain in her knees.      PHYSICAL EXAMINATION:   VITAL SIGNS:     TEMPERATURE:  97.8.   PULSE:  70.   RESPIRATIONS:  20.   BLOOD PRESSURE:  124/82.   CHEST/RESPIRATORY:  Exam is clear.        CARDIOVASCULAR:   CARDIAC:  Heart sounds are normal.  There are no murmurs.    GASTROINTESTINAL:   ABDOMEN:  Soft, nontender.  No masses noted.     LIVER/SPLEEN/KIDNEYS:  No liver, no spleen.   GENITOURINARY:   BLADDER:  Not distended.  There is no CVA tenderness.    CIRCULATION:   EDEMA/VARICOSITIES:  Extremities:  She has no edema.     MUSCULOSKELETAL:   EXTREMITIES:   BILATERAL LOWER EXTREMITIES:  She likely has severe osteoarthritis of both knees.   There is no effusion.    ASSESSMENT/PLAN:                Dementia, which is at its severe stage.  However, this does not look to be in a terminal situation.     Hyponatremia.  Given her overall status for comfort care, I did not even repeat this.  I discussed this in depth with her daughter when she first came into the building, although I have not discussed this recently.    Pain issues.  Probably related to generalized osteoarthritis.  I think this is probably stable.    Risperdal issues.  I have not really  changed this but I am not aware of any behavioral issues at the moment, either.  I think at some point we could probably consider reducing this or tapering to off, although I will not initiate that today.     CPT CODE: [id number]

## 2015-02-03 ENCOUNTER — Other Ambulatory Visit: Payer: Self-pay | Admitting: *Deleted

## 2015-02-03 MED ORDER — HYDROCODONE-ACETAMINOPHEN 5-325 MG PO TABS: ORAL_TABLET | ORAL | Status: DC

## 2015-02-10 LAB — BASIC METABOLIC PANEL
BUN: 26 mg/dL — AB (ref 4–21)
Creatinine: 1.1 mg/dL (ref 0.5–1.1)
GLUCOSE: 92 mg/dL
Potassium: 4.1 mmol/L (ref 3.4–5.3)
Sodium: 136 mmol/L — AB (ref 137–147)

## 2015-02-10 LAB — HEPATIC FUNCTION PANEL
ALT: 6 U/L — AB (ref 7–35)
AST: 12 U/L — AB (ref 13–35)
Alkaline Phosphatase: 55 U/L (ref 25–125)
BILIRUBIN, TOTAL: 0.4 mg/dL

## 2015-03-08 ENCOUNTER — Other Ambulatory Visit: Payer: Self-pay | Admitting: *Deleted

## 2015-03-08 MED ORDER — HYDROCODONE-ACETAMINOPHEN 5-325 MG PO TABS: ORAL_TABLET | ORAL | Status: DC

## 2015-03-08 NOTE — Telephone Encounter (Signed)
[facility] 

## 2015-03-17 ENCOUNTER — Non-Acute Institutional Stay (SKILLED_NURSING_FACILITY): Payer: Medicare Other | Admitting: Nurse Practitioner

## 2015-03-17 ENCOUNTER — Encounter: Payer: Self-pay | Admitting: Nurse Practitioner

## 2015-03-17 DIAGNOSIS — G309 Alzheimer's disease, unspecified: Secondary | ICD-10-CM

## 2015-03-17 DIAGNOSIS — G308 Other Alzheimer's disease: Secondary | ICD-10-CM

## 2015-03-17 DIAGNOSIS — F0281 Dementia in other diseases classified elsewhere with behavioral disturbance: Secondary | ICD-10-CM

## 2015-03-17 DIAGNOSIS — D649 Anemia, unspecified: Secondary | ICD-10-CM

## 2015-03-17 DIAGNOSIS — E871 Hypo-osmolality and hyponatremia: Secondary | ICD-10-CM | POA: Diagnosis not present

## 2015-03-17 DIAGNOSIS — M179 Osteoarthritis of knee, unspecified: Secondary | ICD-10-CM

## 2015-03-17 DIAGNOSIS — M1711 Unilateral primary osteoarthritis, right knee: Secondary | ICD-10-CM

## 2015-03-17 DIAGNOSIS — F418 Other specified anxiety disorders: Secondary | ICD-10-CM | POA: Diagnosis not present

## 2015-03-17 NOTE — Progress Notes (Signed)
Patient ID: Meagan [name], female   DOB: 21-Feb-1924, 82 y.o.   MRN: [id number]  Location:  [facility] Room Number: 215 A Place of Service:  SNF (31)   MAZZOCCHI, Rise Mu, MD  Patient Care Team: Talmadge Coventry, MD as PCP - General (Family Medicine)  Extended Emergency Contact Information Primary Emergency Contact: Theadore Nan [place] Home Phone: [phone] Mobile Phone: [phone] Relation: Daughter Secondary Emergency Contact: Jackson Latino [place] Home Phone: [phone] Mobile Phone: [phone] Relation: Relative  Code Status:  DNR  Goals of care: Advanced Directive information Advanced Directives 03/17/2015  Does patient have an advance directive? Yes  Type of Advance Directive Out of facility DNR (pink MOST or yellow form)  Does patient want to make changes to advanced directive? No - Patient declined  Copy of advanced directive(s) in chart? Yes     Chief Complaint  Patient presents with  . Medical Management of Chronic Issues    Routine Visit    HPI:  Pt is a 82 y.o. female seen today for medical management of chronic diseases. Pt with a hx of dementia with behaviors, hyponatremia, OA, depression. Pt has advanced dementia and daughters want comfort care. Pt previously on celexa due to depression but was stopped due to hx of hyponatremia. Sodium levels have remains stable and since off celexa there has been no noted change in mood. There has been noted weight loss since she has been at [facility]. Staff providing assistance with all ADLs. Staff has no acute concerns at this time.    Past Medical History  Diagnosis Date  . Dementia   . Anxiety   . Alzheimer disease   . COPD (chronic obstructive pulmonary disease) (HCC)   . Hypothyroidism     "on pills years ago; stopped taking them in the 1960's" (11/18/2012)  . History of blood transfusion 1965    "w/hysterectomy" (11/18/2012)  .  Headache(784.0)     "semi frequently; sometimes sinus; sometimes generalized" (11/18/2012)  . Osteoarthritis of both knees   . Depression    Past Surgical History  Procedure Laterality Date  . Tonsillectomy      "twice" (11/18/2012)  . Vaginal hysterectomy  1965  . Dilation and curettage of uterus      "several before hysterectomy" (11/18/2012)  . Cesarean section  1954; 1958  . Nasal sinus surgery Bilateral ~2002  . Cataract extraction w/ intraocular lens  implant, bilateral Bilateral 1990's    Allergies  Allergen Reactions  . Morphine And Related     On Nursing Home MAR, no reaction documented. OK to take vicodin  . Penicillins     On Nursing Home MAR, no reaction documented       Medication List       This list is accurate as of: 03/17/15  4:08 PM.  Always use your most recent med list.               HYDROcodone-acetaminophen 5-325 MG tablet  Commonly known as:  NORCO/VICODIN  Take one tablet by mouth four times daily for pain. Do not exceed 4 gm of Tylenol in 24 hours     LORazepam 0.5 MG tablet  Commonly known as:  ATIVAN  Take one tablet by mouth once daily for anxiety     meloxicam 7.5 MG tablet  Commonly known as:  MOBIC  Take 7.5 mg by mouth daily.     Q-NOL 325 MG tablet  Generic drug:  acetaminophen  Take 325 mg by mouth 2 (two) times daily.     risperiDONE 0.25 MG tablet  Commonly known as:  RISPERDAL  Take 0.25 mg by mouth daily.        Review of Systems  Unable to perform ROS: Dementia    Immunization History  Administered Date(s) Administered  . Influenza-Unspecified 11/04/2012, 11/09/2014   Pertinent  Health Maintenance Due  Topic Date Due  . DEXA SCAN  08/19/1991  . PNA vac Low Risk Adult (1 of 2 - PCV13) 08/19/1991  . INFLUENZA VACCINE  08/22/2015   No flowsheet data found. Functional Status Survey:    Filed Vitals:   03/17/15 1556  BP: 145/68  Pulse: 70  Temp: 97.8 F (36.6 C)  TempSrc: Oral  Resp: 20  Height: 5'  1" (1.549 m)  Weight: 159 lb (72.122 kg)   Body mass index is 30.06 kg/(m^2). Physical Exam  Constitutional: She appears well-developed. No distress.  HENT:  Head: Normocephalic and atraumatic.  Mouth/Throat: Oropharynx is clear and moist. No oropharyngeal exudate.  Eyes: Conjunctivae are normal. Pupils are equal, round, and reactive to light.  Neck: Normal range of motion. Neck supple.  Cardiovascular: Normal rate, regular rhythm and normal heart sounds.   Pulmonary/Chest: Effort normal and breath sounds normal.  Abdominal: Soft. Bowel sounds are normal.  Musculoskeletal: She exhibits no edema or tenderness.  Neurological: She is alert.  Skin: Skin is warm and dry. She is not diaphoretic.  Psychiatric: She has a normal mood and affect. Cognition and memory are impaired. She exhibits abnormal recent memory and abnormal remote memory.    Labs reviewed:  Recent Labs  02/10/15  NA 136*  K 4.1  BUN 26*  CREATININE 1.1    Recent Labs  02/10/15  AST 12*  ALT 6*  ALKPHOS 55   No results for input(s): WBC, NEUTROABS, HGB, HCT, MCV, PLT in the last 8760 hours. Lab Results  Component Value Date   TSH 0.877 02/15/2013   Lab Results  Component Value Date   HGBA1C 5.7* 02/15/2013   Lab Results  Component Value Date   CHOL 220* 11/19/2012   HDL 38* 11/19/2012   LDLCALC 151* 11/19/2012   TRIG 155* 11/19/2012   CHOLHDL 5.8 11/19/2012    Significant Diagnostic Results in last 30 days:  No results found.  Assessment/Plan 1. Hyponatremia Sodium remains stable.   2. Depression with anxiety -celexa has been stopped, no increase in anxiety or depression. conts on ativan daily for anxiety  3. Osteoarthritis of right knee, unspecified osteoarthritis type Pain controlled on mobic, will cont at this time. Also has norco/apap PRN  4. Alzheimer's dementia with behavioral disturbance, unspecified timing of dementia onset - dementia remains stable, behaviors remain stable. conts  on risperdal daily. Weight loss noted, anticipate further decline as disease progresses.   5. Anemia, unspecified anemia type -will follow up CBC at this time.      Meagan [name]. Biagio Borg  [facility] [phone] 8 am - 5 pm) [phone] (after hours)

## 2015-03-22 LAB — CBC AND DIFFERENTIAL
HEMATOCRIT: 31 % — AB (ref 36–46)
HEMOGLOBIN: 9.9 g/dL — AB (ref 12.0–16.0)
Platelets: 269 10*3/uL (ref 150–399)
WBC: 9.2 10^3/mL

## 2015-04-07 ENCOUNTER — Other Ambulatory Visit: Payer: Self-pay | Admitting: *Deleted

## 2015-04-07 MED ORDER — HYDROCODONE-ACETAMINOPHEN 5-325 MG PO TABS: ORAL_TABLET | ORAL | Status: DC

## 2015-04-07 NOTE — Telephone Encounter (Signed)
[facility] 

## 2015-04-19 ENCOUNTER — Non-Acute Institutional Stay (SKILLED_NURSING_FACILITY): Payer: Medicare Other | Admitting: Nurse Practitioner

## 2015-04-19 DIAGNOSIS — G4700 Insomnia, unspecified: Secondary | ICD-10-CM

## 2015-04-19 DIAGNOSIS — F0280 Dementia in other diseases classified elsewhere without behavioral disturbance: Secondary | ICD-10-CM | POA: Diagnosis not present

## 2015-04-19 DIAGNOSIS — M150 Primary generalized (osteo)arthritis: Secondary | ICD-10-CM

## 2015-04-19 DIAGNOSIS — M159 Polyosteoarthritis, unspecified: Secondary | ICD-10-CM | POA: Insufficient documentation

## 2015-04-19 DIAGNOSIS — G309 Alzheimer's disease, unspecified: Secondary | ICD-10-CM

## 2015-04-19 DIAGNOSIS — D649 Anemia, unspecified: Secondary | ICD-10-CM

## 2015-04-19 NOTE — Assessment & Plan Note (Addendum)
SNF for care needs, not taking memory preserving meds. Risperdal 0.25mg  daily for mood and behaviors. Last TSH 0.877 02/16/15, Hgb a1c 5.7

## 2015-04-19 NOTE — Assessment & Plan Note (Signed)
03/22/15 Hgb 9.9, no active bleeding noted.

## 2015-04-19 NOTE — Assessment & Plan Note (Signed)
Pain is managed with Meloxicam 7.5mg  qd, Norco qid, Tylenol 325mg  bid.

## 2015-04-19 NOTE — Progress Notes (Signed)
Patient ID: Meagan [name], female   DOB: 10-24-1924, 82 y.o.   MRN: [id number]  Location:  [facility] Room Number: 215 A Place of Service:  SNF (31) Provider: Chipper OmanManXie Peityn Payton NP  MAZZOCCHI, Rise MuANNMARIE, MD  Patient Care Team: Talmadge CoventryAnnmarie Mazzocchi, MD as PCP - General (Family Medicine)  Extended Emergency Contact Information Primary Emergency Contact: Theadore Nanollins,Barbara  [place] Home Phone: [phone] Mobile Phone: [phone] Relation: Daughter Secondary Emergency Contact: Jackson Latinoollins,Larry  [place] Home Phone: [phone] Mobile Phone: [phone] Relation: Relative  Code Status:  DNR Goals of care: Advanced Directive information Advanced Directives 04/19/2015  Does patient have an advance directive? Yes  Type of Advance Directive Out of facility DNR (pink MOST or yellow form)  Does patient want to make changes to advanced directive? No - Patient declined  Copy of advanced directive(s) in chart? Yes     Chief Complaint  Patient presents with  . Medical Management of Chronic Issues    Routine Visit    HPI:  Pt is a 82 y.o. female seen today for medical management of chronic diseases.  Hx of Alzheimer's, resides in SNF for care needs, not taking memory preserving meds, she takes Risperdal 0.25mg  qd, Lorazepam 0.5mg  qd for behaviors and mood, also takes Norco qid, Tylenol bid, and Meloxicam 7.5mg  daily for multiple sites osteoarthrotic pain.    Past Medical History  Diagnosis Date  . Dementia   . Anxiety   . Alzheimer disease   . COPD (chronic obstructive pulmonary disease) (HCC)   . Hypothyroidism     "on pills years ago; stopped taking them in the 1960's" (11/18/2012)  . History of blood transfusion 1965    "w/hysterectomy" (11/18/2012)  . Headache(784.0)     "semi frequently; sometimes sinus; sometimes generalized" (11/18/2012)  . Osteoarthritis of both knees   . Depression    Past Surgical History    Procedure Laterality Date  . Tonsillectomy      "twice" (11/18/2012)  . Vaginal hysterectomy  1965  . Dilation and curettage of uterus      "several before hysterectomy" (11/18/2012)  . Cesarean section  1954; 1958  . Nasal sinus surgery Bilateral ~2002  . Cataract extraction w/ intraocular lens  implant, bilateral Bilateral 1990's    Allergies  Allergen Reactions  . Morphine And Related     On Nursing Home MAR, no reaction documented. OK to take vicodin  . Penicillins     On Nursing Home MAR, no reaction documented       Medication List       This list is accurate as of: 04/19/15 11:59 PM.  Always use your most recent med list.               feeding supplement Liqd  Take 1 Container by mouth 2 (two) times daily between meals.     HYDROcodone-acetaminophen 5-325 MG tablet  Commonly known as:  NORCO/VICODIN  Take one tablet by mouth four times daily for pain. Do not exceed 4 gm of Tylenol in 24 hours     LORazepam 0.5 MG tablet  Commonly known as:  ATIVAN  Take one tablet by mouth once daily for anxiety     meloxicam 7.5 MG tablet  Commonly known as:  MOBIC  Take 7.5 mg by mouth daily.     Q-NOL 325 MG tablet  Generic drug:  acetaminophen  Take 325 mg by mouth 2 (two) times daily.     risperiDONE 0.25  MG tablet  Commonly known as:  RISPERDAL  Take 0.25 mg by mouth daily.        Review of Systems  Unable to perform ROS: Dementia    Immunization History  Administered Date(s) Administered  . Influenza-Unspecified 11/04/2012, 11/09/2014   Pertinent  Health Maintenance Due  Topic Date Due  . DEXA SCAN  08/19/1991  . PNA vac Low Risk Adult (1 of 2 - PCV13) 08/19/1991  . INFLUENZA VACCINE  08/22/2015   No flowsheet data found. Functional Status Survey:    Filed Vitals:   04/19/15 0918  BP: 145/68  Pulse: 70  Temp: 97.8 F (36.6 C)  TempSrc: Oral  Resp: 20  Height:  (1.549 m)  Weight: 157 lb (71.215 kg)  SpO2: 92%   Body mass index is  29.68 kg/(m^2). Physical Exam  Constitutional: She appears well-developed. No distress.  HENT:  Head: Normocephalic and atraumatic.  Mouth/Throat: Oropharynx is clear and moist. No oropharyngeal exudate.  Eyes: Conjunctivae are normal. Pupils are equal, round, and reactive to light.  Neck: Normal range of motion. Neck supple.  Cardiovascular: Normal rate, regular rhythm and normal heart sounds.   Pulmonary/Chest: Effort normal and breath sounds normal.  Abdominal: Soft. Bowel sounds are normal.  Musculoskeletal: She exhibits no edema or tenderness.  Neurological: She is alert.  Skin: Skin is warm and dry. She is not diaphoretic.  Psychiatric: She has a normal mood and affect. Cognition and memory are impaired. She exhibits abnormal recent memory and abnormal remote memory.    Labs reviewed:  Recent Labs  02/10/15  NA 136*  K 4.1  BUN 26*  CREATININE 1.1    Recent Labs  02/10/15  AST 12*  ALT 6*  ALKPHOS 55    Recent Labs  03/22/15  WBC 9.2  HGB 9.9*  HCT 31*  PLT 269   Lab Results  Component Value Date   TSH 0.877 02/15/2013   Lab Results  Component Value Date   HGBA1C 5.7* 02/15/2013   Lab Results  Component Value Date   CHOL 220* 11/19/2012   HDL 38* 11/19/2012   LDLCALC 151* 11/19/2012   TRIG 155* 11/19/2012   CHOLHDL 5.8 11/19/2012    Significant Diagnostic Results in last 30 days:  No results found.  Assessment/Plan  Alzheimer's dementia SNF for care needs, not taking memory preserving meds. Risperdal 0.25mg  daily for mood and behaviors. Last TSH 0.877 02/16/15, Hgb a1c 5.7  Osteoarthritis, multiple sites Pain is managed with Meloxicam 7.5mg  qd, Norco qid, Tylenol  bid.   Anemia 03/22/15 Hgb 9.9, no active bleeding noted.   Insomnia Complaining of difficulty falling asleep, will increase Lorazepam to  and administer at bedtime. Observe the patient    Family/ staff Communication: continue SNF for care needs.   Labs/tests ordered:   none

## 2015-04-20 ENCOUNTER — Encounter: Payer: Self-pay | Admitting: Nurse Practitioner

## 2015-05-03 DIAGNOSIS — G4700 Insomnia, unspecified: Secondary | ICD-10-CM | POA: Insufficient documentation

## 2015-05-03 NOTE — Assessment & Plan Note (Signed)
Complaining of difficulty falling asleep, will increase Lorazepam to 1mg  and administer at bedtime. Observe the patient

## 2015-06-14 ENCOUNTER — Non-Acute Institutional Stay (SKILLED_NURSING_FACILITY): Payer: Medicare Other | Admitting: Nurse Practitioner

## 2015-06-14 ENCOUNTER — Encounter: Payer: Self-pay | Admitting: Nurse Practitioner

## 2015-06-14 DIAGNOSIS — M159 Polyosteoarthritis, unspecified: Secondary | ICD-10-CM

## 2015-06-14 DIAGNOSIS — M150 Primary generalized (osteo)arthritis: Secondary | ICD-10-CM | POA: Diagnosis not present

## 2015-06-14 DIAGNOSIS — G309 Alzheimer's disease, unspecified: Secondary | ICD-10-CM | POA: Diagnosis not present

## 2015-06-14 DIAGNOSIS — G4700 Insomnia, unspecified: Secondary | ICD-10-CM | POA: Diagnosis not present

## 2015-06-14 DIAGNOSIS — F0280 Dementia in other diseases classified elsewhere without behavioral disturbance: Secondary | ICD-10-CM | POA: Diagnosis not present

## 2015-06-14 DIAGNOSIS — D649 Anemia, unspecified: Secondary | ICD-10-CM

## 2015-06-14 NOTE — Assessment & Plan Note (Signed)
Pain is managed with Meloxicam 7.5mg  qd, Norco qid, Tylenol 325mg  bid.

## 2015-06-14 NOTE — Progress Notes (Signed)
Patient ID: Meagan SaleSybil Gick, female   DOB: February 06, 1924, 82 y.o.   MRN: [id number]  [facility] Room Number: 215 A Place of Service:  SNF (31) Provider: Chipper OmanManXie Zoee Heeney NP  MAZZOCCHI, Rise MuANNMARIE, MD  Patient Care Team: Talmadge CoventryAnnmarie Mazzocchi, MD as PCP - General (Family Medicine)  Extended Emergency Contact Information Primary Emergency Contact: Theadore Nanollins,Barbara  [place] Home Phone: [phone] Mobile Phone: [phone] Relation: Daughter Secondary Emergency Contact: Jackson Latinoollins,Larry  [place] Home Phone: [phone] Mobile Phone: [phone] Relation: Relative  Code Status:  DNR Goals of care: Advanced Directive information Advanced Directives 06/14/2015  Does patient have an advance directive? Yes  Type of Advance Directive Out of facility DNR (pink MOST or yellow form);[place]care Power of [place];Living will  Does patient want to make changes to advanced directive? No - Patient declined  Copy of advanced directive(s) in chart? Yes     Chief Complaint  Patient presents with  . Medical Management of Chronic Issues    HPI:  Pt is a 82 y.o. female seen today for medical management of chronic diseases.  Hx of Alzheimer's, resides in SNF for care needs, not taking memory preserving meds, she takes Risperdal 0.25mg  qd, Lorazepam 0.5mg  qd for behaviors and mood, also takes Norco qid, Tylenol bid, and Meloxicam 7.5mg  daily for multiple sites osteoarthrotic pain. Hx of anemia, last Hgb 9.9 03/22/15.   Past Medical History  Diagnosis Date  . Dementia   . Anxiety   . Alzheimer disease   . COPD (chronic obstructive pulmonary disease) (HCC)   . Hypothyroidism     "on pills years ago; stopped taking them in the 1960's" (11/18/2012)  . History of blood transfusion 1965    "w/hysterectomy" (11/18/2012)  . Headache(784.0)     "semi frequently; sometimes sinus; sometimes generalized" (11/18/2012)  . Osteoarthritis of both  knees   . Depression    Past Surgical History  Procedure Laterality Date  . Tonsillectomy      "twice" (11/18/2012)  . Vaginal hysterectomy  1965  . Dilation and curettage of uterus      "several before hysterectomy" (11/18/2012)  . Cesarean section  1954; 1958  . Nasal sinus surgery Bilateral ~2002  . Cataract extraction w/ intraocular lens  implant, bilateral Bilateral 1990's    Allergies  Allergen Reactions  . Morphine And Related     On Nursing Home MAR, no reaction documented. OK to take vicodin  . Penicillins     On Nursing Home MAR, no reaction documented       Medication List       This list is accurate as of: 06/14/15  1:47 PM.  Always use your most recent med list.               feeding supplement Liqd  Take 1 Container by mouth 2 (two) times daily between meals.     HYDROcodone-acetaminophen 5-325 MG tablet  Commonly known as:  NORCO/VICODIN  Take one tablet by mouth four times daily for pain. Do not exceed 4 gm of Tylenol in 24 hours     LORazepam 0.5 MG tablet  Commonly known as:  ATIVAN  Take one tablet by mouth once daily for anxiety     meloxicam 7.5 MG tablet  Commonly known as:  MOBIC  Take 7.5 mg by mouth daily.        Review of Systems  Unable to perform ROS: Dementia    Immunization History  Administered Date(s)  Administered  . Influenza-Unspecified 11/04/2012, 11/09/2014   Pertinent  Health Maintenance Due  Topic Date Due  . DEXA SCAN  08/19/1991  . PNA vac Low Risk Adult (1 of 2 - PCV13) 08/19/1991  . INFLUENZA VACCINE  08/22/2015   No flowsheet data found. Functional Status Survey:    There were no vitals filed for this visit. There is no weight on file to calculate BMI. Physical Exam  Constitutional: She appears well-developed. No distress.  HENT:  Head: Normocephalic and atraumatic.  Mouth/Throat: Oropharynx is clear and moist. No oropharyngeal exudate.  Eyes: Conjunctivae are normal. Pupils are equal, round, and  reactive to light.  Neck: Normal range of motion. Neck supple.  Cardiovascular: Normal rate, regular rhythm and normal heart sounds.   Pulmonary/Chest: Effort normal and breath sounds normal.  Abdominal: Soft. Bowel sounds are normal.  Musculoskeletal: She exhibits no edema or tenderness.  Neurological: She is alert.  Skin: Skin is warm and dry. She is not diaphoretic.  Psychiatric: She has a normal mood and affect. Cognition and memory are impaired. She exhibits abnormal recent memory and abnormal remote memory.    Labs reviewed:  Recent Labs  02/10/15  NA 136*  K 4.1  BUN 26*  CREATININE 1.1    Recent Labs  02/10/15  AST 12*  ALT 6*  ALKPHOS 55    Recent Labs  03/22/15  WBC 9.2  HGB 9.9*  HCT 31*  PLT 269   Lab Results  Component Value Date   TSH 0.877 02/15/2013   Lab Results  Component Value Date   HGBA1C 5.7* 02/15/2013   Lab Results  Component Value Date   CHOL 220* 11/19/2012   HDL 38* 11/19/2012   LDLCALC 151* 11/19/2012   TRIG 155* 11/19/2012   CHOLHDL 5.8 11/19/2012    Significant Diagnostic Results in last 30 days:  No results found.  Assessment/Plan  Alzheimer's dementia SNF for care needs, not taking memory preserving meds. Risperdal 0.25mg  daily for mood and behaviors. Last TSH 0.877 02/16/15, Hgb a1c 5.7. Update CBC, CMP, TSH  Anemia 03/22/15 Hgb 9.9, no active bleeding noted.    Insomnia Complaining of difficulty falling asleep, better with Lorazepam  and administer at bedtime. Observe the patient   Osteoarthritis, multiple sites Pain is managed with Meloxicam 7.5mg  qd, Norco qid, Tylenol  bid.     Family/ staff Communication: continue SNF for care needs.   Labs/tests ordered:  CBC, CMP, TSH

## 2015-06-14 NOTE — Assessment & Plan Note (Signed)
Complaining of difficulty falling asleep, better with Lorazepam 1mg  and administer at bedtime. Observe the patient

## 2015-06-14 NOTE — Assessment & Plan Note (Signed)
SNF for care needs, not taking memory preserving meds. Risperdal 0.25mg  daily for mood and behaviors. Last TSH 0.877 02/16/15, Hgb a1c 5.7. Update CBC, CMP, TSH

## 2015-06-14 NOTE — Assessment & Plan Note (Signed)
03/22/15 Hgb 9.9, no active bleeding noted.

## 2015-07-10 ENCOUNTER — Other Ambulatory Visit: Payer: Self-pay | Admitting: *Deleted

## 2015-07-10 MED ORDER — HYDROCODONE-ACETAMINOPHEN 5-325 MG PO TABS: ORAL_TABLET | ORAL | Status: AC

## 2015-07-10 NOTE — Telephone Encounter (Signed)
[facility] 

## 2015-07-10 NOTE — Telephone Encounter (Signed)
DISREGARD RX---DISCARDED. NO LONGER SERVICE [facility]. PRINTED BY MISTAKE

## 2015-11-20 IMAGING — CR DG KNEE COMPLETE 4+V*R*
4 series · 4 of 4 positions shown · non-contrast
Comparison: None.

CLINICAL DATA: Right knee pain, fall.

EXAM:
RIGHT KNEE - COMPLETE 4+ VIEW

[view not recorded (1 of 4)]
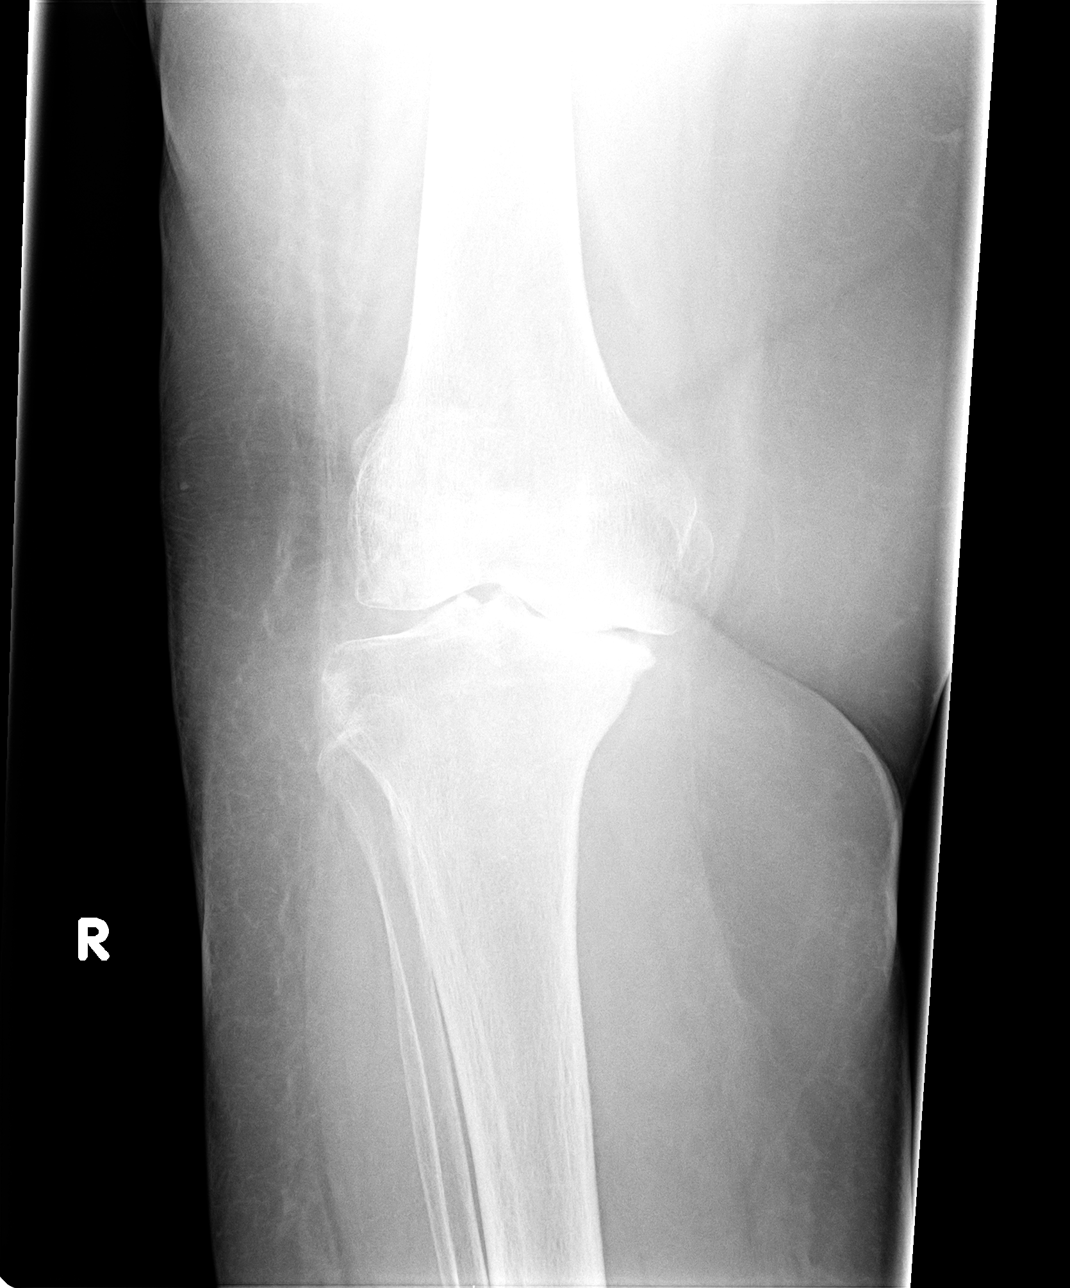

[view not recorded (2 of 4)]
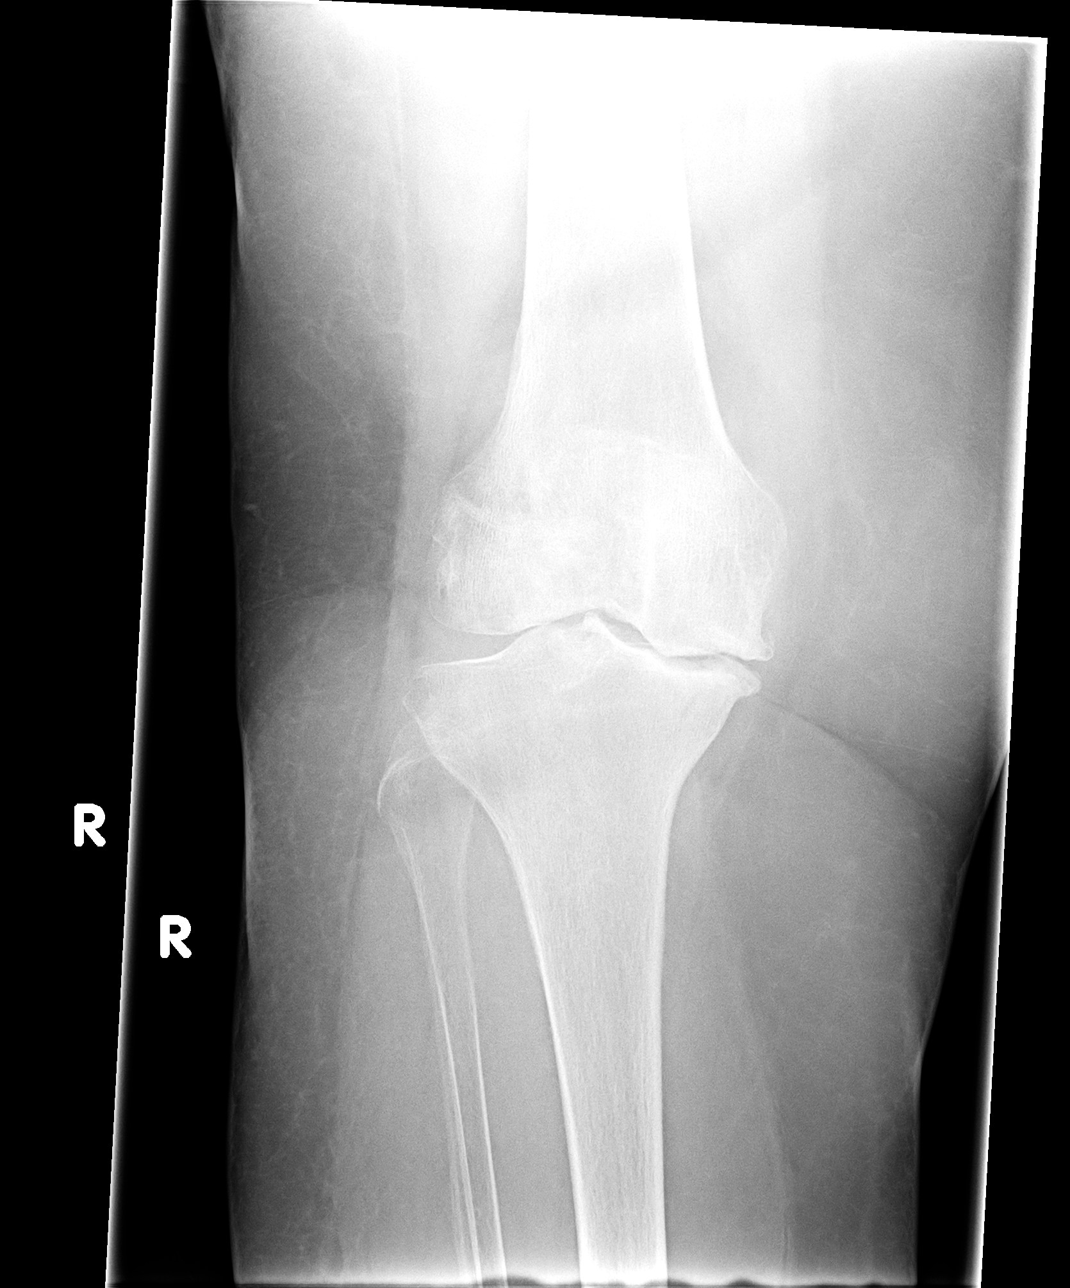

[view not recorded (3 of 4)]
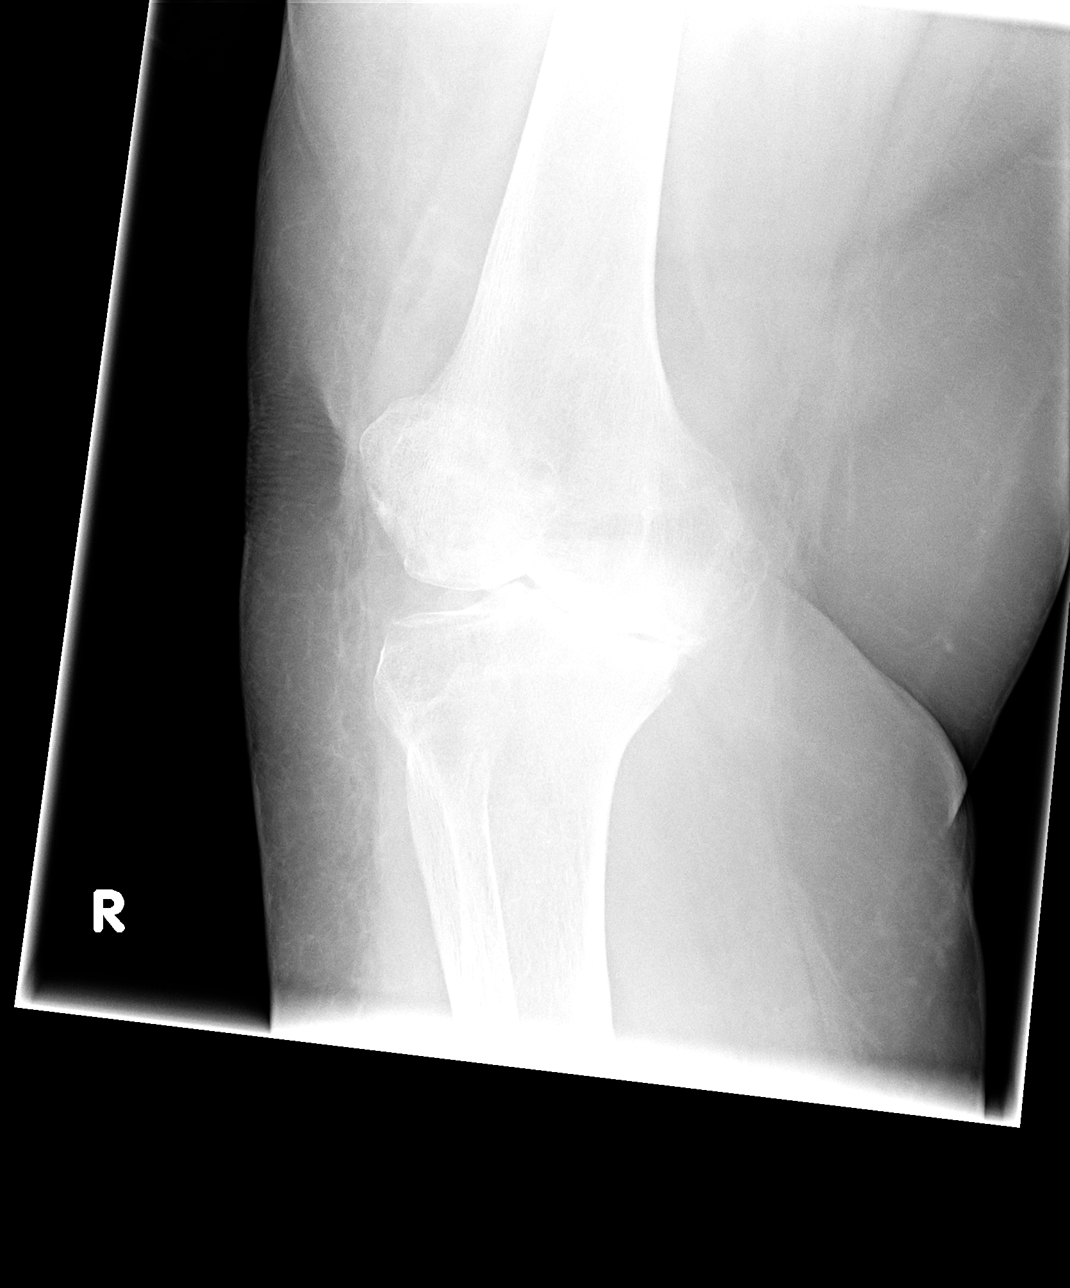

[view not recorded (4 of 4)]
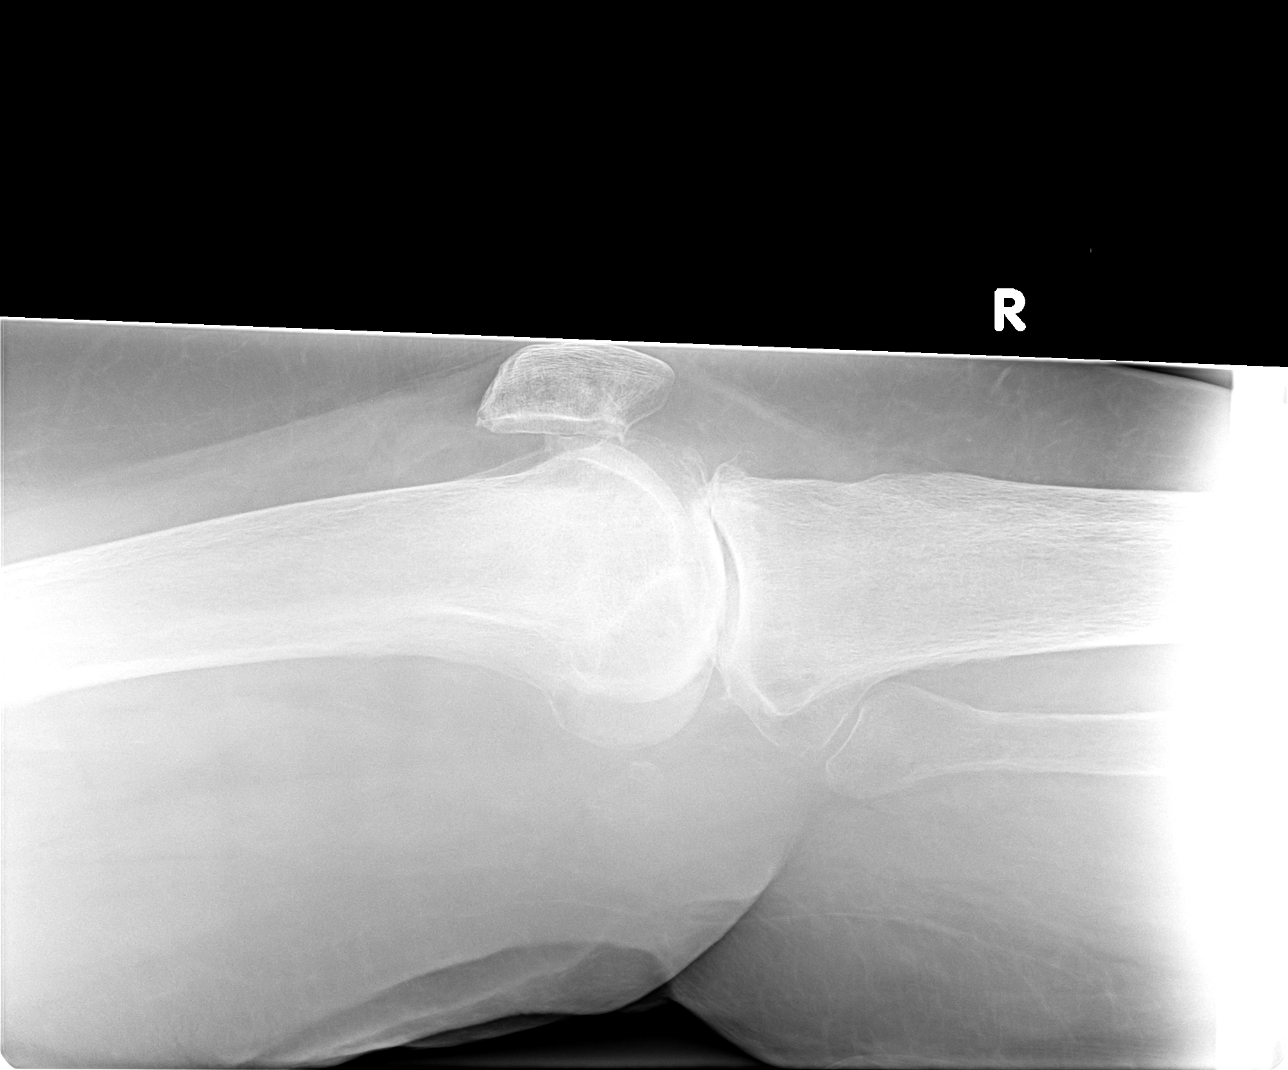

[4 of 4 positions shown; findings below may reference images not displayed]

FINDINGS: Advanced degenerative joint disease changes within the right knee,
most pronounced in the medial and patellofemoral compartments. Near
complete joint space loss in the medial compartment. No acute bony
abnormality. Specifically, no fracture, subluxation, or dislocation.
Soft tissues are intact. No joint effusion.
IMPRESSION: Advanced tricompartment degenerative changes, most pronounced in the
medial compartment. No acute bony abnormality.
